# Patient Record
Sex: Male | Born: 2000 | Race: White | Hispanic: No | Marital: Single | Smoking: Never smoker
Health system: Southern US, Community
[De-identification: ages and names within clinical notes are randomized; demographics above are authoritative.]

## PROBLEM LIST (undated history)

## (undated) DIAGNOSIS — I10 Essential (primary) hypertension: Secondary | ICD-10-CM

## (undated) DIAGNOSIS — F909 Attention-deficit hyperactivity disorder, unspecified type: Secondary | ICD-10-CM

## (undated) DIAGNOSIS — J45909 Unspecified asthma, uncomplicated: Secondary | ICD-10-CM

## (undated) HISTORY — PX: KNEE SURGERY: SHX244

## (undated) HISTORY — PX: CIRCUMCISION: SUR203

## (undated) HISTORY — PX: TYMPANOSTOMY TUBE PLACEMENT: SHX32

---
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
## 2019-06-21 ENCOUNTER — Encounter (HOSPITAL_COMMUNITY): Payer: Self-pay

## 2019-06-21 ENCOUNTER — Emergency Department (HOSPITAL_COMMUNITY): Payer: Medicaid Other

## 2019-06-21 ENCOUNTER — Emergency Department (HOSPITAL_COMMUNITY)
Admission: EM | Admit: 2019-06-21 | Discharge: 2019-06-21 | Disposition: A | Discharge status: Home / Self Care | Payer: Medicaid Other | Attending: Emergency Medicine | Admitting: Emergency Medicine

## 2019-06-21 ENCOUNTER — Other Ambulatory Visit: Payer: Self-pay

## 2019-06-21 DIAGNOSIS — F909 Attention-deficit hyperactivity disorder, unspecified type: Secondary | ICD-10-CM | POA: Insufficient documentation

## 2019-06-21 DIAGNOSIS — Z79899 Other long term (current) drug therapy: Secondary | ICD-10-CM | POA: Insufficient documentation

## 2019-06-21 DIAGNOSIS — M25562 Pain in left knee: Secondary | ICD-10-CM

## 2019-06-21 DIAGNOSIS — J45909 Unspecified asthma, uncomplicated: Secondary | ICD-10-CM | POA: Insufficient documentation

## 2019-06-21 DIAGNOSIS — I10 Essential (primary) hypertension: Secondary | ICD-10-CM | POA: Insufficient documentation

## 2019-06-21 MED ORDER — ACETAMINOPHEN 325 MG PO TABS
650.0000 mg | ORAL_TABLET | Freq: Once | ORAL | Status: AC
  Administered 2019-06-21: 650 mg via ORAL
  Filled 2019-06-21: qty 2

## 2019-06-21 NOTE — ED Triage Notes (Signed)
Arrived POV from home. Patient reports his left knee "gave out" on him and when he tried to get up he was unable to stand up on leg. Patient ambulated to and from waiting room with limping gait.

## 2019-06-21 NOTE — Discharge Instructions (Signed)
As discussed, your x-ray was negative for any broken bones.  Continue to use the knee brace and crutches as needed for comfort.  You may take over-the-counter Tylenol as needed for pain.  I have included the number of your orthopedic surgeon.  If symptoms do not improve within the next week, call to schedule an appointment for further evaluation.  Continue to ice and elevate left leg.  Return to the ER for new or worsening symptoms.

## 2019-06-21 NOTE — ED Provider Notes (Signed)
[facility]-EMERGENCY DEPT Provider Note   CSN: [id number] Arrival date & time: 06/21/19  2024     History Chief Complaint  Patient presents with  . Knee Pain    Robert Anthony is a 19 y.o. male with a past medical history significant for ADHD, asthma, and hypertension who presents to the ED due to sudden onset of left knee pain.  Patient states he was working today and his left knee "gave out" causing him to fall directly on his right leg. Denies head injury and back injury from his fall.  He notes his left leg has given out 3 more times since the first event. Patient had a previous left knee surgery in 2017.  He rates his pain a 6/10 worse with movement, especially ambulation.  Denies associated numbness and tingling.  Denies erythema, edema, and warmth. 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  He has tried Tylenol with moderate relief. No recent left knee injury.   History obtained from patient and past medical records. No interpreter used during encounter.       Past Medical History:  Diagnosis Date  . ADHD   . Asthma   . Hypertension     Patient Active Problem List   Diagnosis Date Noted  . Migraine without aura and without status migrainosus, not intractable 07/18/2014  . History of concussion 07/18/2014  . Tension headache 07/18/2014  . Difficulty sleeping 07/18/2014  . Postconcussion syndrome 07/18/2014    Past Surgical History:  Procedure Laterality Date  . CIRCUMCISION    . KNEE SURGERY    . TYMPANOSTOMY TUBE PLACEMENT Bilateral        Family History  Problem Relation Age of Onset  . Migraines Mother   . Depression Mother   . Anxiety disorder Mother   . Bipolar disorder Father   . Depression Father   . Migraines Sister   . Migraines Maternal Uncle   . Mental retardation Maternal Uncle   . Diabetes Other   . Hyperlipidemia Other   . Hypertension Other     Social History   Tobacco Use  . Smoking  status: Never Smoker  . Smokeless tobacco: Never Used  Vaping Use  . Vaping Use: Former  Substance Use Topics  . Alcohol use: No  . Drug use: No    Home Medications Prior to Admission medications   Medication Sig Start Date End Date Taking? Authorizing Provider  amitriptyline (ELAVIL) 25 MG tablet Take 1 tablet (25 mg total) by mouth at bedtime. Patient not taking: Reported on 09/07/2016 07/18/14   Keturah Shavers, MD  amphetamine-dextroamphetamine (ADDERALL) 5 MG tablet Take 1 tablet by mouth every evening. 06/28/14   [provider]  hydrochlorothiazide (HYDRODIURIL) 25 MG tablet Take 1 tablet (25 mg total) by mouth daily. 10/30/17   Raeford Razor, MD  VYVANSE 60 MG capsule Take 60 mg by mouth every morning. 06/28/14   [provider]    Allergies    Patient has no known allergies.  Review of Systems   Review of Systems  Musculoskeletal: Positive for arthralgias and gait problem. Negative for joint swelling.  Skin: Negative for color change.  Neurological: Negative for numbness.  All other systems reviewed and are negative.   Physical Exam Updated Vital Signs BP (!) 159/78 (BP Location: Left Arm)   Pulse (!) 104   Temp 98.3 F (36.8 C) (Oral)   Resp 18   Ht 5\' 11"  (1.803  m)   Wt 111.1 kg   SpO2 100%   BMI 34.17 kg/m   Physical Exam Vitals and nursing note reviewed.  Constitutional:      General: He is not in acute distress.    Appearance: He is not ill-appearing.  HENT:     Head: Normocephalic.  Eyes:     Pupils: Pupils are equal, round, and reactive to light.  Cardiovascular:     Rate and Rhythm: Normal rate and regular rhythm.     Pulses: Normal pulses.     Heart sounds: Normal heart sounds. No murmur heard.  No friction rub. No gallop.   Pulmonary:     Effort: Pulmonary effort is normal.     Breath sounds: Normal breath sounds.  Abdominal:     General: Abdomen is flat. There is no distension.     Palpations: Abdomen is soft.      Tenderness: There is no abdominal tenderness. There is no guarding or rebound.  Musculoskeletal:     Cervical back: Neck supple.     Comments: TTP on anterior aspect of left knee. Full extension and flexion of knee.  No erythema, edema, or warmth.  Distal pulses and sensation intact.  Soft compartments.  No tenderness at left hip or left ankle.  Skin:    General: Skin is warm and dry.  Neurological:     General: No focal deficit present.     Mental Status: He is alert.  Psychiatric:        Mood and Affect: Mood normal.        Behavior: Behavior normal.     ED Results / Procedures / Treatments   Labs (all labs ordered are listed, but only abnormal results are displayed) Labs Reviewed - No data to display  EKG None  Radiology DG Knee Complete 4 Views Left  Result Date: 06/21/2019 CLINICAL DATA:  Knee gave out, pain EXAM: LEFT KNEE - COMPLETE 4+ VIEW COMPARISON:  11/17/2013 FINDINGS: Calcifications within the inferior aspect of the patellar tendon may reflect old injury. Thickening of the inferior patellar tendon. No acute fracture, subluxation or dislocation. No joint effusion. IMPRESSION: Calcifications and thickening of the inferior patellar tendon may reflect old injury. No acute bony abnormality. Electronically Signed   By: Rolm Baptise M.D.   On: 06/21/2019 21:03    Procedures Procedures (including critical care time)  Medications Ordered in ED Medications  acetaminophen (TYLENOL) tablet 650 mg (has no administration in time range)    ED Course  I have reviewed the triage vital signs and the nursing notes.  Pertinent labs & imaging results that were available during my care of the patient were reviewed by me and considered in my medical decision making (see chart for details).    MDM Rules/Calculators/A&P                         19 year old male presents to the ED due to left knee pain that started earlier today.  Patient any previous left knee surgery in 2017.  Stable  vitals.  Patient in no acute distress and non-ill-appearing.  Physical exam reassuring.  Tenderness palpation over anterior aspect of left knee.  Patient able to fully extend and flex left knee.  Patient able to ambulate in the ED with a mild limp.  No erythema, edema, or warmth to suggest septic arthritis.  X-ray ordered at triage and personally reviewed which is negative for any acute abnormalities.  Knee sleeve  and crutches provided here in the ED.  Tylenol given for pain management.  Will discharge patient with orthopedic referral.  RICE discussed with patient.  Advised patient to call orthopedic surgeon if symptoms do not improve within the next week. Strict ED precautions discussed with patient. Patient states understanding and agrees to plan. Patient discharged home in no acute distress and stable vitals  Final Clinical Impression(s) / ED Diagnoses Final diagnoses:  Acute pain of left knee    Rx / DC Orders ED Discharge Orders    None       Jesusita Oka 06/21/19 2223    Charlynne Pander, MD 06/21/19 [phone]

## 2019-07-12 ENCOUNTER — Encounter (HOSPITAL_COMMUNITY): Payer: Self-pay | Admitting: *Deleted

## 2019-07-12 ENCOUNTER — Emergency Department (HOSPITAL_COMMUNITY)
Admission: EM | Admit: 2019-07-12 | Discharge: 2019-07-13 | Disposition: A | Discharge status: Home / Self Care | Payer: Medicaid Other | Attending: Emergency Medicine | Admitting: Emergency Medicine

## 2019-07-12 ENCOUNTER — Other Ambulatory Visit: Payer: Self-pay

## 2019-07-12 ENCOUNTER — Emergency Department (HOSPITAL_COMMUNITY): Payer: Medicaid Other

## 2019-07-12 DIAGNOSIS — Z5321 Procedure and treatment not carried out due to patient leaving prior to being seen by health care provider: Secondary | ICD-10-CM | POA: Insufficient documentation

## 2019-07-12 DIAGNOSIS — R0789 Other chest pain: Secondary | ICD-10-CM | POA: Insufficient documentation

## 2019-07-12 DIAGNOSIS — R000 Tachycardia, unspecified: Secondary | ICD-10-CM | POA: Insufficient documentation

## 2019-07-12 LAB — BASIC METABOLIC PANEL
Anion gap: 9 (ref 5–15)
BUN: 9 mg/dL (ref 6–20)
CO2: 24 mmol/L (ref 22–32)
Calcium: 9.7 mg/dL (ref 8.9–10.3)
Chloride: 106 mmol/L (ref 98–111)
Creatinine, Ser: 1.07 mg/dL (ref 0.61–1.24)
GFR calc Af Amer: >60 mL/min (ref >60)
GFR calc non Af Amer: >60 mL/min (ref >60)
Glucose, Bld: 95 mg/dL (ref 70–99)
Potassium: 4.1 mmol/L (ref 3.5–5.1)
Sodium: 139 mmol/L (ref 135–145)

## 2019-07-12 LAB — CBC
HCT: 49.4 % (ref 39.0–52.0)
Hemoglobin: 17.9 g/dL — ABNORMAL HIGH (ref 13.0–17.0)
MCH: 32.7 pg (ref 26.0–34.0)
MCHC: 36.2 g/dL — ABNORMAL HIGH (ref 30.0–36.0)
MCV: 90.3 fL (ref 80.0–100.0)
Platelets: 155 10*3/uL (ref 150–400)
RBC: 5.47 MIL/uL (ref 4.22–5.81)
RDW: 11.3 % — ABNORMAL LOW (ref 11.5–15.5)
WBC: 8.1 10*3/uL (ref 4.0–10.5)
nRBC: 0 % (ref 0.0–0.2)

## 2019-07-12 LAB — TROPONIN I (HIGH SENSITIVITY)
Troponin I (High Sensitivity): 3 ng/L (ref <18)
Troponin I (High Sensitivity): 2 ng/L (ref <18)

## 2019-07-12 MED ORDER — SODIUM CHLORIDE 0.9% FLUSH: 3.0000 mL | Freq: Once | INTRAVENOUS | Status: DC

## 2019-07-12 NOTE — ED Notes (Signed)
Pt leaving AMA. Pt stated he is going to a different hospital.

## 2019-07-12 NOTE — ED Triage Notes (Signed)
Pt states for several weeks has had left sided chest pain and high heart rate. Pt was seen at [facility] for the same on Sunday, says it has not gotten better.

## 2019-07-26 ENCOUNTER — Ambulatory Visit: Payer: Medicaid Other | Admitting: Orthopedic Surgery

## 2019-08-04 ENCOUNTER — Ambulatory Visit: Payer: Medicaid Other | Admitting: Surgical

## 2020-07-10 ENCOUNTER — Emergency Department (HOSPITAL_COMMUNITY)
Admission: EM | Admit: 2020-07-10 | Discharge: 2020-07-10 | Disposition: A | Discharge status: Home / Self Care | Payer: Medicaid Other | Attending: Emergency Medicine | Admitting: Emergency Medicine

## 2020-07-10 ENCOUNTER — Emergency Department (HOSPITAL_COMMUNITY): Payer: Medicaid Other

## 2020-07-10 ENCOUNTER — Other Ambulatory Visit: Payer: Self-pay

## 2020-07-10 DIAGNOSIS — I10 Essential (primary) hypertension: Secondary | ICD-10-CM | POA: Diagnosis not present

## 2020-07-10 DIAGNOSIS — J45909 Unspecified asthma, uncomplicated: Secondary | ICD-10-CM | POA: Diagnosis not present

## 2020-07-10 DIAGNOSIS — Z79899 Other long term (current) drug therapy: Secondary | ICD-10-CM | POA: Insufficient documentation

## 2020-07-10 DIAGNOSIS — R079 Chest pain, unspecified: Secondary | ICD-10-CM | POA: Insufficient documentation

## 2020-07-10 LAB — CBC WITH DIFFERENTIAL/PLATELET
Abs Immature Granulocytes: 0.02 10*3/uL (ref 0.00–0.07)
Basophils Absolute: 0.1 10*3/uL (ref 0.0–0.1)
Basophils Relative: 1 %
Eosinophils Absolute: 0.2 10*3/uL (ref 0.0–0.5)
Eosinophils Relative: 3 %
HCT: 45 % (ref 39.0–52.0)
Hemoglobin: 16.2 g/dL (ref 13.0–17.0)
Immature Granulocytes: 0 %
Lymphocytes Relative: 26 %
Lymphs Abs: 1.9 10*3/uL (ref 0.7–4.0)
MCH: 32.2 pg (ref 26.0–34.0)
MCHC: 36 g/dL (ref 30.0–36.0)
MCV: 89.5 fL (ref 80.0–100.0)
Monocytes Absolute: 0.5 10*3/uL (ref 0.1–1.0)
Monocytes Relative: 7 %
Neutro Abs: 4.6 10*3/uL (ref 1.7–7.7)
Neutrophils Relative %: 63 %
Platelets: 137 10*3/uL — ABNORMAL LOW (ref 150–400)
RBC: 5.03 MIL/uL (ref 4.22–5.81)
RDW: 11.4 % — ABNORMAL LOW (ref 11.5–15.5)
WBC: 7.2 10*3/uL (ref 4.0–10.5)
nRBC: 0 % (ref 0.0–0.2)

## 2020-07-10 LAB — BASIC METABOLIC PANEL
Anion gap: 9 (ref 5–15)
BUN: 8 mg/dL (ref 6–20)
CO2: 19 mmol/L — ABNORMAL LOW (ref 22–32)
Calcium: 8.9 mg/dL (ref 8.9–10.3)
Chloride: 111 mmol/L (ref 98–111)
Creatinine, Ser: 0.77 mg/dL (ref 0.61–1.24)
GFR, Estimated: >60 mL/min (ref >60)
Glucose, Bld: 83 mg/dL (ref 70–99)
Potassium: 3.5 mmol/L (ref 3.5–5.1)
Sodium: 139 mmol/L (ref 135–145)

## 2020-07-10 LAB — TROPONIN I (HIGH SENSITIVITY): Troponin I (High Sensitivity): 2 ng/L (ref <18)

## 2020-07-10 MED ORDER — KETOROLAC TROMETHAMINE 30 MG/ML IJ SOLN
30.0000 mg | Freq: Once | INTRAMUSCULAR | Status: AC
  Administered 2020-07-10: 30 mg via INTRAVENOUS
  Filled 2020-07-10: qty 1

## 2020-07-10 NOTE — ED Notes (Signed)
Informed pt. Provider will be in shortly.

## 2020-07-10 NOTE — ED Provider Notes (Signed)
[facility] EMERGENCY DEPARTMENT Provider Note   CSN: [id number] Arrival date & time: 07/10/20  1722     History Chief Complaint  Patient presents with   Chest Pain    Robert Anthony is a 20 y.o. male.  HPI  20 year old male with past medical history of asthma, HTN presents emergency department concern for right-sided chest pain.  Patient states on Friday his fiance broke up with him.  Since then he has been having intermittent right-sided chest pain, brief, sharp, self resolved.  He has had pain like this before last time when he had a stressful life event.  He presents today because the pain when it resolved left a soreness in the right-sided chest.  No associated shortness of breath, cough, lower extremity swelling.  Pain is worse with movements.  No fever.  Past Medical History:  Diagnosis Date   ADHD    Asthma    Hypertension     Patient Active Problem List   Diagnosis Date Noted   Migraine without aura and without status migrainosus, not intractable 07/18/2014   History of concussion 07/18/2014   Tension headache 07/18/2014   Difficulty sleeping 07/18/2014   Postconcussion syndrome 07/18/2014    Past Surgical History:  Procedure Laterality Date   CIRCUMCISION     KNEE SURGERY     TYMPANOSTOMY TUBE PLACEMENT Bilateral        Family History  Problem Relation Age of Onset   Migraines Mother    Depression Mother    Anxiety disorder Mother    Bipolar disorder Father    Depression Father    Migraines Sister    Migraines Maternal Uncle    Mental retardation Maternal Uncle    Diabetes Other    Hyperlipidemia Other    Hypertension Other     Social History   Tobacco Use   Smoking status: Never   Smokeless tobacco: Never  Vaping Use   Vaping Use: Former  Substance Use Topics   Alcohol use: No   Drug use: No    Home Medications Prior to Admission medications   Medication Sig Start Date End Date Taking? Authorizing Provider   amitriptyline (ELAVIL) 25 MG tablet Take 1 tablet (25 mg total) by mouth at bedtime. Patient not taking: No sig reported 07/18/14   Keturah Shavers, MD  amphetamine-dextroamphetamine (ADDERALL) 5 MG tablet Take 1 tablet by mouth every evening. Patient not taking: Reported on 07/10/2020 06/28/14   [provider]  hydrochlorothiazide (HYDRODIURIL) 25 MG tablet Take 1 tablet (25 mg total) by mouth daily. Patient not taking: Reported on 07/10/2020 10/30/17   Raeford Razor, MD    Allergies    Patient has no known allergies.  Review of Systems   Review of Systems  Constitutional:  Negative for chills and fever.  HENT:  Negative for congestion.   Respiratory:  Negative for cough, chest tightness and shortness of breath.   Cardiovascular:  Positive for chest pain. Negative for palpitations and leg swelling.  Gastrointestinal:  Negative for abdominal pain, diarrhea and vomiting.  Genitourinary:  Negative for dysuria.  Musculoskeletal:  Negative for back pain.  Skin:  Negative for rash.  Neurological:  Negative for headaches.   Physical Exam Updated Vital Signs BP 135/71   Pulse 96   Temp 98 F (36.7 C) (Oral)   Resp (!) 22   Ht 6' (1.829 m)   Wt 111.1 kg   SpO2 97%   BMI 33.23 kg/m   Physical Exam Vitals  and nursing note reviewed.  Constitutional:      General: He is not in acute distress.    Appearance: Normal appearance. He is not ill-appearing, toxic-appearing or diaphoretic.  HENT:     Head: Normocephalic.     Mouth/Throat:     Mouth: Mucous membranes are moist.  Cardiovascular:     Rate and Rhythm: Normal rate.  Pulmonary:     Effort: Pulmonary effort is normal. No respiratory distress.  Chest:     Chest wall: Tenderness present.  Abdominal:     Palpations: Abdomen is soft.     Tenderness: There is no abdominal tenderness.  Skin:    General: Skin is warm.  Neurological:     Mental Status: He is alert and oriented to person, place, and time. Mental status is  at baseline.  Psychiatric:        Mood and Affect: Mood normal.    ED Results / Procedures / Treatments   Labs (all labs ordered are listed, but only abnormal results are displayed) Labs Reviewed  CBC WITH DIFFERENTIAL/PLATELET - Abnormal; Notable for the following components:      Result Value   RDW 11.4 (*)    Platelets 137 (*)    All other components within normal limits  BASIC METABOLIC PANEL - Abnormal; Notable for the following components:   CO2 19 (*)    All other components within normal limits  TROPONIN I (HIGH SENSITIVITY)  TROPONIN I (HIGH SENSITIVITY)    EKG EKG Interpretation  Date/Time:  Wednesday July 10 2020 17:35:36 EDT Ventricular Rate:  98 PR Interval:  162 QRS Duration: 91 QT Interval:  329 QTC Calculation: 420 R Axis:   64 Text Interpretation: Sinus rhythm Atrial premature complex NSR, similar to previous Confirmed by Coralee Pesa [phone]) on 07/10/2020 5:52:30 PM  Radiology DG Chest 2 View  Result Date: 07/10/2020 CLINICAL DATA:  Chest pain for 2 days. EXAM: CHEST - 2 VIEW COMPARISON:  07/12/2019 FINDINGS: The heart size and mediastinal contours are within normal limits. Both lungs are clear. The visualized skeletal structures are unremarkable. IMPRESSION: Normal exam. Electronically Signed   By: Danae Orleans M.D.   On: 07/10/2020 19:15    Procedures Procedures   Medications Ordered in ED Medications  ketorolac (TORADOL) 30 MG/ML injection 30 mg (30 mg Intravenous Given 07/10/20 1845)    ED Course  I have reviewed the triage vital signs and the nursing notes.  Pertinent labs & imaging results that were available during my care of the patient were reviewed by me and considered in my medical decision making (see chart for details).    MDM Rules/Calculators/A&P                          20 year old male presents the emergency department with right-sided chest pain.  He has had pain like this before related to a stressful life event, he is currently  undergoing a break-up with his fiance.  Vitals are normal and stable.  He is very well-appearing.  He has worse chest pain with movement and it is reproducible on exam.  EKG shows no ischemic changes.  Cardiac work-up is negative, do not feel a repeat troponin is necessary as this seems very musculoskeletal.  Low suspicion for AC.  No findings consistent with PE, he is PERC negative.  After Toradol patient states the pain is completely resolved.  Patient will be discharged and treated as an outpatient.  Discharge plan and  strict return to ED precautions discussed, patient verbalizes understanding and agreement.   Final Clinical Impression(s) / ED Diagnoses Final diagnoses:  Chest pain, unspecified type    Rx / DC Orders ED Discharge Orders     None        Rozelle Logan, DO 07/10/20 2116

## 2020-07-10 NOTE — ED Triage Notes (Signed)
Pt bib ems DUE TO chest pain. Pt states he broke up with his fiance 2 days ago. Pt has a hx of bipolar and depression. Pt is hypertensive. Pt took 324 of aspirin via ems.

## 2020-07-10 NOTE — ED Notes (Signed)
Patient verbalizes understanding of discharge instructions. Follow-up care reviewed. Opportunity for questioning and answers were provided. Armband removed by staff, pt discharged from ED ambulatory. Work note provided.

## 2020-07-10 NOTE — ED Notes (Signed)
Patient transported to X-ray 

## 2020-07-10 NOTE — ED Notes (Signed)
Patient returned from X-ray 

## 2020-07-10 NOTE — Discharge Instructions (Addendum)
You have been seen and discharged from the emergency department.  Your heart work-up was normal.  I believe your pain is most likely musculoskeletal/stress related.  Take Tylenol and/or ibuprofen as needed for pain control.  Follow-up with your primary provider for reevaluation and further care. Take home medications as prescribed. If you have any worsening symptoms or further concerns for your health please return to an emergency department for further evaluation.

## 2021-02-07 ENCOUNTER — Emergency Department (HOSPITAL_COMMUNITY): Payer: Medicaid Other

## 2021-02-07 ENCOUNTER — Other Ambulatory Visit: Payer: Self-pay

## 2021-02-07 ENCOUNTER — Emergency Department (HOSPITAL_COMMUNITY)
Admission: EM | Admit: 2021-02-07 | Discharge: 2021-02-07 | Disposition: A | Discharge status: Home / Self Care | Payer: Medicaid Other | Attending: Emergency Medicine | Admitting: Emergency Medicine

## 2021-02-07 ENCOUNTER — Encounter (HOSPITAL_COMMUNITY): Payer: Self-pay

## 2021-02-07 DIAGNOSIS — R000 Tachycardia, unspecified: Secondary | ICD-10-CM | POA: Insufficient documentation

## 2021-02-07 DIAGNOSIS — J020 Streptococcal pharyngitis: Secondary | ICD-10-CM | POA: Insufficient documentation

## 2021-02-07 DIAGNOSIS — R590 Localized enlarged lymph nodes: Secondary | ICD-10-CM | POA: Diagnosis not present

## 2021-02-07 DIAGNOSIS — J029 Acute pharyngitis, unspecified: Secondary | ICD-10-CM | POA: Diagnosis present

## 2021-02-07 LAB — CBC WITH DIFFERENTIAL/PLATELET
Abs Immature Granulocytes: 0.05 10*3/uL (ref 0.00–0.07)
Basophils Absolute: 0.1 10*3/uL (ref 0.0–0.1)
Basophils Relative: 0 %
Eosinophils Absolute: 0.1 10*3/uL (ref 0.0–0.5)
Eosinophils Relative: 1 %
HCT: 49.6 % (ref 39.0–52.0)
Hemoglobin: 18.2 g/dL — ABNORMAL HIGH (ref 13.0–17.0)
Immature Granulocytes: 0 %
Lymphocytes Relative: 12 %
Lymphs Abs: 1.5 10*3/uL (ref 0.7–4.0)
MCH: 32.6 pg (ref 26.0–34.0)
MCHC: 36.7 g/dL — ABNORMAL HIGH (ref 30.0–36.0)
MCV: 88.7 fL (ref 80.0–100.0)
Monocytes Absolute: 0.8 10*3/uL (ref 0.1–1.0)
Monocytes Relative: 7 %
Neutro Abs: 9.4 10*3/uL — ABNORMAL HIGH (ref 1.7–7.7)
Neutrophils Relative %: 80 %
Platelets: 142 10*3/uL — ABNORMAL LOW (ref 150–400)
RBC: 5.59 MIL/uL (ref 4.22–5.81)
RDW: 11.2 % — ABNORMAL LOW (ref 11.5–15.5)
WBC: 11.9 10*3/uL — ABNORMAL HIGH (ref 4.0–10.5)
nRBC: 0 % (ref 0.0–0.2)

## 2021-02-07 LAB — BASIC METABOLIC PANEL
Anion gap: 10 (ref 5–15)
BUN: 12 mg/dL (ref 6–20)
CO2: 21 mmol/L — ABNORMAL LOW (ref 22–32)
Calcium: 9.3 mg/dL (ref 8.9–10.3)
Chloride: 106 mmol/L (ref 98–111)
Creatinine, Ser: 0.73 mg/dL (ref 0.61–1.24)
GFR, Estimated: >60 mL/min (ref >60)
Glucose, Bld: 94 mg/dL (ref 70–99)
Potassium: 4 mmol/L (ref 3.5–5.1)
Sodium: 137 mmol/L (ref 135–145)

## 2021-02-07 LAB — GROUP A STREP BY PCR: Group A Strep by PCR: DETECTED — AB

## 2021-02-07 MED ORDER — DEXAMETHASONE SODIUM PHOSPHATE 10 MG/ML IJ SOLN
10.0000 mg | Freq: Once | INTRAMUSCULAR | Status: AC
  Administered 2021-02-07: 10 mg via INTRAVENOUS
  Filled 2021-02-07: qty 1

## 2021-02-07 MED ORDER — PENICILLIN V POTASSIUM 500 MG PO TABS: 500.0000 mg | ORAL_TABLET | Freq: Two times a day (BID) | ORAL | 0 refills | Status: AC

## 2021-02-07 MED ORDER — IOHEXOL 300 MG/ML  SOLN
75.0000 mL | Freq: Once | INTRAMUSCULAR | Status: AC | PRN
  Administered 2021-02-07: 75 mL via INTRAVENOUS

## 2021-02-07 MED ORDER — SODIUM CHLORIDE (PF) 0.9 % IJ SOLN
INTRAMUSCULAR | Status: AC
  Filled 2021-02-07: qty 50

## 2021-02-07 NOTE — ED Notes (Signed)
Patient offered a beverage. °

## 2021-02-07 NOTE — Discharge Instructions (Addendum)
You were seen in the emergency department today for sore throat.  While you are here you are diagnosed with strep throat.  We took an image of your neck because you were having some difficulty swallowing.  This image was reassuring.  We gave you some steroids to reduce the swelling in the back your throat.  You will start penicillin you will take it twice a day over the next 10 days.  Please take this to completion.  Please return to the emergency department if he began having drooling, inability to swallow liquids, wheezing, difficulty breathing or shortness of breath.

## 2021-02-07 NOTE — ED Provider Triage Note (Signed)
Emergency Medicine Provider Triage Evaluation Note  Robert Anthony , a 21 y.o. male  was evaluated in triage.  Pt complains of her throat and throat swelling.  Patient states that since Monday he began having sore throat which has progressed to today where he feels like he has difficulty swallowing and tolerating his secretions.  He has been unable to eat foods.  Dors is change in voice.  He denies any fevers, other upper respiratory symptoms.  Mother notes that he appears to have some swelling in his neck.  No wheezing or stridor.  Review of Systems  Positive: See above Negative:   Physical Exam  BP (!) 152/103 (BP Location: Left Arm)    Pulse (!) 106    Temp 98.8 F (37.1 C) (Oral)    Resp 16    Ht 5\' 11"  (1.803 m)    Wt 113.4 kg    SpO2 98%    BMI 34.87 kg/m  Gen:   Awake, no distress   Resp:  Normal effort  MSK:   Moves extremities without difficulty  Other:  Oropharynx with trismus.  Difficulty seeing the posterior oropharynx.  He does have tonsillar swelling that I can appreciate.  No swelling under the tongue.  Cervical adenopathy present.  Tenderness to palpation particularly on the right side.  Potato voice.  Medical Decision Making  Medically screening exam initiated at 12:46 PM.  Appropriate orders placed.  [facility] was informed that the remainder of the evaluation will be completed by another provider, this initial triage assessment does not replace that evaluation, and the importance of remaining in the ED until their evaluation is complete.     Beryle Quant, PA-C 02/07/21 1248

## 2021-02-07 NOTE — ED Triage Notes (Addendum)
Patient reports having a sore throat x 1 week and worsening. Patient states that it is difficult to swallow. Patient is talking in complete sentences and is able to swallow his own saliva. Patient does have swelling to the throat.  Patient went to an UC and was placed on the z-pack 4 days ago.

## 2021-02-07 NOTE — ED Provider Notes (Signed)
[facility]-EMERGENCY DEPT Provider Note   CSN: [id number] Arrival date & time: 02/07/21  1231     History  Chief Complaint  Patient presents with   Sore Throat    Robert Anthony is a 21 y.o. male.  With no pertinent past medical history presents to the emergency department with sore throat.  Patient states that since Monday he has began having sore throat which she states has progressed throughout the week.  He states that today he has had difficulty swallowing and tolerating his secretions at times.  He states that he has been unable to eat solid foods but has had some liquids.  He does endorse feeling like his had a change in voice.  He states his mother has noted that he appears to have some swelling in his neck.  He denies any fevers, cough, shortness of breath, wheezing, chest pain, congestion or rhinorrhea.   Sore Throat Pertinent negatives include no shortness of breath.      Home Medications Prior to Admission medications   Medication Sig Start Date End Date Taking? Authorizing Provider  amitriptyline (ELAVIL) 25 MG tablet Take 1 tablet (25 mg total) by mouth at bedtime. Patient not taking: No sig reported 07/18/14   Keturah Shavers, MD  amphetamine-dextroamphetamine (ADDERALL) 5 MG tablet Take 1 tablet by mouth every evening. Patient not taking: Reported on 07/10/2020 06/28/14   [provider]  hydrochlorothiazide (HYDRODIURIL) 25 MG tablet Take 1 tablet (25 mg total) by mouth daily. Patient not taking: Reported on 07/10/2020 10/30/17   Raeford Razor, MD      Allergies    Patient has no known allergies.    Review of Systems   Review of Systems  Constitutional:  Positive for appetite change. Negative for fever.  HENT:  Positive for sore throat and trouble swallowing. Negative for congestion and drooling.   Respiratory:  Negative for shortness of breath.   All other systems reviewed and are negative.  Physical Exam Updated Vital  Signs BP (!) 152/103 (BP Location: Left Arm)    Pulse (!) 106    Temp 98.8 F (37.1 C) (Oral)    Resp 16    Ht 5\' 11"  (1.803 m)    Wt 113.4 kg    SpO2 98%    BMI 34.87 kg/m  Physical Exam Vitals and nursing note reviewed.  Constitutional:      General: He is not in acute distress.    Appearance: Normal appearance. He is well-developed. He is ill-appearing. He is not toxic-appearing.  HENT:     Head: Normocephalic and atraumatic.     Jaw: Trismus present.     Right Ear: Tympanic membrane normal.     Left Ear: Tympanic membrane normal.     Nose: No congestion.     Mouth/Throat:     Mouth: Mucous membranes are moist.     Dentition: Normal dentition.     Pharynx: Uvula midline. Pharyngeal swelling, oropharyngeal exudate and posterior oropharyngeal erythema present.     Tonsils: Tonsillar exudate present. No tonsillar abscesses. 2+ on the right. 2+ on the left.  Eyes:     General: No scleral icterus.    Conjunctiva/sclera: Conjunctivae normal.  Cardiovascular:     Rate and Rhythm: Regular rhythm. Tachycardia present.     Heart sounds: Normal heart sounds. No murmur heard. Pulmonary:     Effort: Pulmonary effort is normal. No respiratory distress.     Breath sounds: Normal breath sounds. No stridor. No wheezing.  Abdominal:     General: Bowel sounds are normal.     Palpations: Abdomen is soft.  Musculoskeletal:     Cervical back: Normal range of motion.  Lymphadenopathy:     Cervical: Cervical adenopathy present.  Skin:    General: Skin is warm and dry.     Capillary Refill: Capillary refill takes less than 2 seconds.     Findings: No rash.  Neurological:     General: No focal deficit present.     Mental Status: He is alert and oriented to person, place, and time.  Psychiatric:        Mood and Affect: Mood normal.        Behavior: Behavior normal.    ED Results / Procedures / Treatments   Labs (all labs ordered are listed, but only abnormal results are displayed) Labs  Reviewed  GROUP A STREP BY PCR - Abnormal; Notable for the following components:      Result Value   Group A Strep by PCR DETECTED (*)    All other components within normal limits  BASIC METABOLIC PANEL - Abnormal; Notable for the following components:   CO2 21 (*)    All other components within normal limits  CBC WITH DIFFERENTIAL/PLATELET - Abnormal; Notable for the following components:   WBC 11.9 (*)    Hemoglobin 18.2 (*)    MCHC 36.7 (*)    RDW 11.2 (*)    Platelets 142 (*)    Neutro Abs 9.4 (*)    All other components within normal limits   EKG None  Radiology CT Soft Tissue Neck W Contrast  Result Date: 02/07/2021 CLINICAL DATA:  Provided history: Soft tissue swelling, infection suspected, neck x-ray done. Additional history provided: Patient reports sore throat for 1 week, worsening, difficulty swallowing. EXAM: CT NECK WITH CONTRAST TECHNIQUE: Multidetector CT imaging of the neck was performed using the standard protocol following the bolus administration of intravenous contrast. RADIATION DOSE REDUCTION: This exam was performed according to the departmental dose-optimization program which includes automated exposure control, adjustment of the mA and/or kV according to patient size and/or use of iterative reconstruction technique. CONTRAST:  75mL OMNIPAQUE IOHEXOL 300 MG/ML  SOLN COMPARISON:  None FINDINGS: Pharynx and larynx: Symmetric prominence of the palatine tonsils. No evidence of peritonsillar abscess. The oropharyngeal airway remains patent. No appreciable swelling or discrete mass elsewhere within the oral cavity, pharynx or larynx. Unremarkable appearance of the epiglottis. No retropharyngeal collection. Salivary glands: No inflammation, mass, or stone. Thyroid: Normal. Lymph nodes: Bilateral level 2/3 lymphadenopathy with lymph nodes measuring up to 16 mm in short axis (for instance as seen on series 6, image 63). Vascular: The major vascular structures of the neck are  patent. Limited intracranial: No evidence of acute intracranial abnormality within the field of view. Visualized orbits: No orbital mass or acute orbital finding. 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 Small-volume fluid within a posterior right ethmoid air cell. Mild mucosal thickening and small mucous retention cysts within the bilateral maxillary sinuses. No significant mastoid effusion. Skeleton: Cervicothoracic levocurvature. Straightening of the expected cervical lordosis. No acute bony abnormality or aggressive osseous lesion. Upper chest: No consolidation within the imaged lung apices. IMPRESSION: Symmetric prominence of the palatine tonsils. Bilateral level 2/3 lymphadenopathy (with lymph nodes measuring up to 16 mm in short axis). This constellation of findings likely reflects acute pharyngitis/tonsillitis with reactive lymphadenopathy given the provided history. However, clinical follow-up is recommended (with imaging follow-up as  warranted) to ensure resolution and to exclude alternative etiologies. No evidence of peritonsillar abscess. Right ethmoid and bilateral maxillary sinus disease, as described. Cervicothoracic levocurvature. Electronically Signed   By: Jackey LogeKyle  Golden D.O.   On: 02/07/2021 14:51    Procedures Procedures   Medications Ordered in ED Medications  sodium chloride (PF) 0.9 % injection (has no administration in time range)  dexamethasone (DECADRON) injection 10 mg (10 mg Intravenous Given 02/07/21 1341)  iohexol (OMNIPAQUE) 300 MG/ML solution 75 mL (75 mLs Intravenous Contrast Given 02/07/21 1422)    ED Course/ Medical Decision Making/ A&P                           Medical Decision Making Amount and/or Complexity of Data Reviewed Labs: ordered. Radiology: ordered.  Risk Prescription drug management.  Patient presents to the ED with complaints of sore throat. This involves an extensive number of treatment options,  and is a complaint that carries with it a moderate risk of complications and morbidity.   Additional history obtained:  Additional history obtained from: Mother External records from outside source obtained and reviewed including: Previous ED visits  Lab Results: I personally ordered, reviewed, and interpreted labs. Pertinent results include: Group A strep positive CBC with leukocytosis to 11.9 BMP without electrolyte abnormalities  Imaging Studies ordered:  I ordered imaging studies which included CT.  I independently reviewed & interpreted imaging & am in agreement with radiology impression. Imaging shows: CT soft tissue of the neck shows prominence of the palatine tonsils.  Lymphadenopathy.  Findings likely due to pharyngitis/tonsillitis with reactive lymphadenopathy.  No evidence of peritonsillar abscess.  Medications  I ordered medication including Decadron for swelling Reevaluation of the patient after medication shows that patient improved  Critical Interventions: Steroid  ED Course: 21 year old male who presents to the emergency department with sore throat.  No history of immunocompromise.  Nontoxic in appearance.  His physical exam was somewhat concerning with trismus, inability to tolerate solid foods.  However tolerating secretions, no stridor, wheezing or imminent airway closure.  Group A strep positive.  He does have tonsillar swelling with exudates present.  Given IV Decadron with improvement of his symptoms throughout his emergency department stay.  He feels that his trismus has improved as well as his ability to swallow.  Given p.o. trial here in the emergency department without difficulty.  Given history and exam and low suspicion for PTA, RPA, Ludewig's angina, epiglottitis, bacterial tracheitis, EBV, HIV.  Given that his group A strep is positive we will treat him with penicillin.  Instructed to complete the entire course, which he verbalized understanding.  Discussed  return precautions for worsening difficulty breathing, drooling, wheezing, inability to swallow liquids.  He verbalized understanding.  After consideration of the diagnostic results and the patients response to treatment, I feel that the patent would benefit from discharge. The patient has been appropriately medically screened and/or stabilized in the ED. I have low suspicion for any other emergent medical condition which would require further screening, evaluation or treatment in the ED or require inpatient management. The patient is overall well appearing and non-toxic in appearance. They are hemodynamically stable at time of discharge.   Final Clinical Impression(s) / ED Diagnoses Final diagnoses:  Strep throat    Rx / DC Orders ED Discharge Orders          Ordered    penicillin v potassium (VEETID) 500 MG tablet  2 times daily  02/07/21 1543              Cristopher Peru, PA-C 02/07/21 1651    Rolan Bucco, MD 02/07/21 [phone]

## 2022-07-04 ENCOUNTER — Encounter (HOSPITAL_COMMUNITY): Payer: Self-pay | Admitting: Emergency Medicine

## 2022-07-04 ENCOUNTER — Emergency Department (HOSPITAL_COMMUNITY): Payer: Medicaid Other

## 2022-07-04 ENCOUNTER — Other Ambulatory Visit: Payer: Self-pay

## 2022-07-04 ENCOUNTER — Emergency Department (HOSPITAL_COMMUNITY)
Admission: EM | Admit: 2022-07-04 | Discharge: 2022-07-04 | Disposition: A | Discharge status: Home / Self Care | Payer: Medicaid Other | Attending: Emergency Medicine | Admitting: Emergency Medicine

## 2022-07-04 DIAGNOSIS — Y990 Civilian activity done for income or pay: Secondary | ICD-10-CM | POA: Diagnosis not present

## 2022-07-04 DIAGNOSIS — S93402A Sprain of unspecified ligament of left ankle, initial encounter: Secondary | ICD-10-CM

## 2022-07-04 DIAGNOSIS — W1830XA Fall on same level, unspecified, initial encounter: Secondary | ICD-10-CM | POA: Insufficient documentation

## 2022-07-04 DIAGNOSIS — M25572 Pain in left ankle and joints of left foot: Secondary | ICD-10-CM | POA: Diagnosis present

## 2022-07-04 DIAGNOSIS — S93492A Sprain of other ligament of left ankle, initial encounter: Secondary | ICD-10-CM | POA: Insufficient documentation

## 2022-07-04 MED ORDER — IBUPROFEN 800 MG PO TABS: 800.0000 mg | ORAL_TABLET | Freq: Three times a day (TID) | ORAL | 0 refills | Status: AC

## 2022-07-04 MED ORDER — IBUPROFEN 800 MG PO TABS
800.0000 mg | ORAL_TABLET | Freq: Once | ORAL | Status: AC
  Administered 2022-07-04: 800 mg via ORAL
  Filled 2022-07-04: qty 1

## 2022-07-04 NOTE — ED Triage Notes (Signed)
Pt to ED via [facility] from work c/o fall tonight.  Was walking when suddenly left leg became numb and gave out on patient.  Denies hitting head or LOC.

## 2022-07-04 NOTE — ED Provider Notes (Signed)
[place] EMERGENCY DEPARTMENT AT [facility] Provider Note   CSN: [id number] Arrival date & time: 07/04/22  1191     History  Chief Complaint  Patient presents with   Robert Anthony is a 22 y.o. male who presents to the ED via EMS after a fall at work.  Patient reports that he felt numbness in his left thigh prior to falling.  He landed on his left knee and left hand and wrist.  Patient is on any blood thinners, denies loss of consciousness or head injury at the time of the incident.  Since then he has had some pain to his left wrist and knee.  Patient did not take any medication for pain prior to his arrival to the ED.  He has a history of surgery to the left knee in 2017 to remove loose bone fragments but has not had any pain in the knee since then, until today.  Patient reports that he has been experiencing "shooting numbness and pain" that occurs in random locations in his left leg for the past 2 days.  He has never experienced anything like this before.  Patient reports that about 3 days ago he twisted his left ankle and reports persistent pain and swelling.  He is able to ambulate on that leg.  He is unsure if the numbness and pain that he is now experiencing is due to hurting his ankle or not.  No back pain or hip pain.  He denies any other complaints or concerns at this time.    Home Medications Prior to Admission medications   Medication Sig Start Date End Date Taking? Authorizing Provider  ibuprofen (ADVIL) 800 MG tablet Take 1 tablet (800 mg total) by mouth 3 (three) times daily for 10 days. 07/04/22 07/14/22 Yes Maxwell Marion, PA-C  amitriptyline (ELAVIL) 25 MG tablet Take 1 tablet (25 mg total) by mouth at bedtime. Patient not taking: No sig reported 07/18/14   Keturah Shavers, MD  amphetamine-dextroamphetamine (ADDERALL) 5 MG tablet Take 1 tablet by mouth every evening. Patient not taking: Reported on 07/10/2020 06/28/14   [provider]   hydrochlorothiazide (HYDRODIURIL) 25 MG tablet Take 1 tablet (25 mg total) by mouth daily. Patient not taking: Reported on 07/10/2020 10/30/17   Raeford Razor, MD      Allergies    Patient has no known allergies.    Review of Systems   Review of Systems  Musculoskeletal:  Positive for joint swelling.       Left wrist, ankle, and knee pain  All other systems reviewed and are negative.   Physical Exam Updated Vital Signs BP 128/78 (BP Location: Right Arm)   Pulse 92   Temp 98.4 F (36.9 C) (Oral)   Resp 16   Ht 5\' 11"  (1.803 m)   Wt 113.4 kg   SpO2 99%   BMI 34.87 kg/m  Physical Exam Vitals and nursing note reviewed.  Constitutional:      Appearance: Normal appearance.  HENT:     Head: Normocephalic and atraumatic.     Mouth/Throat:     Mouth: Mucous membranes are moist.  Eyes:     Conjunctiva/sclera: Conjunctivae normal.     Pupils: Pupils are equal, round, and reactive to light.  Cardiovascular:     Rate and Rhythm: Normal rate and regular rhythm.     Pulses: Normal pulses.     Heart sounds: Normal heart sounds.  Pulmonary:  Effort: Pulmonary effort is normal.     Breath sounds: Normal breath sounds.  Abdominal:     Palpations: Abdomen is soft.     Tenderness: There is no abdominal tenderness.  Musculoskeletal:        General: Tenderness present. Normal range of motion.     Comments: Tenderness to palpation of volar aspect of left wrist, left tibial tuberosity, and left lateral ankle with mild swelling.  No snuffbox tenderness appreciated. full range of motion of left wrist, knee, and ankle appreciated on exam.   Skin:    General: Skin is warm and dry.     Capillary Refill: Capillary refill takes less than 2 seconds.     Findings: No erythema, lesion or rash.  Neurological:     General: No focal deficit present.     Mental Status: He is alert.     Sensory: No sensory deficit.     Motor: No weakness.  Psychiatric:        Mood and Affect: Mood normal.         Behavior: Behavior normal.     ED Results / Procedures / Treatments   Labs (all labs ordered are listed, but only abnormal results are displayed) Labs Reviewed - No data to display  EKG None  Radiology DG Ankle Complete Left  Result Date: 07/04/2022 CLINICAL DATA:  Twisting left ankle injury a few days ago with pain. EXAM: LEFT ANKLE COMPLETE - 3+ VIEW COMPARISON:  None Available. FINDINGS: There is no evidence of fracture, dislocation, or joint effusion. There is no evidence of arthropathy or other focal bone abnormality. Soft tissues are unremarkable. IMPRESSION: Negative. Electronically Signed   By: Elberta Fortis M.D.   On: 07/04/2022 09:38   DG Wrist Complete Left  Result Date: 07/04/2022 CLINICAL DATA:  22 year old male with history of left wrist pain. EXAM: LEFT WRIST - COMPLETE 3+ VIEW COMPARISON:  Left hand radiograph 10/10/2015. FINDINGS: Mild ulna minus variance. No acute displaced fracture, subluxation or dislocation. IMPRESSION: 1. No acute radiographic abnormality of the left wrist. 2. Mild ulna minus variance. Electronically Signed   By: Trudie Reed M.D.   On: 07/04/2022 07:58   DG Knee 2 Views Left  Result Date: 07/04/2022 CLINICAL DATA:  22 year old male with history of left knee pain. EXAM: LEFT KNEE - 1-2 VIEW COMPARISON:  No priors. FINDINGS: No evidence of fracture, dislocation, or joint effusion. No evidence of arthropathy or other focal bone abnormality. Soft tissues are unremarkable. IMPRESSION: Negative. Electronically Signed   By: Trudie Reed M.D.   On: 07/04/2022 07:56    Procedures Procedures: not indicated.   Medications Ordered in ED Medications  ibuprofen (ADVIL) tablet 800 mg (800 mg Oral Given 07/04/22 0750)    ED Course/ Medical Decision Making/ A&P                             Medical Decision Making Amount and/or Complexity of Data Reviewed Radiology: ordered.  Risk Prescription drug management.   Patient is a 22 year old  male with no significant past medical history who presents ED today after a fall.  Patient did not lose consciousness or hit his head but did land on his left hand and left knee after experiencing an episode of numbness and pain in his left thigh, which caused his leg to give out.  He reports twisting his left ankle a day prior to the onset of sporadic leg numbness and pain.  My differentials include: sciatic nerve pain, arthritic changes, ligamentous injury, dislocation, fracture, etc.  On physical exam, patient has tenderness to the left tibial tuberosity, swelling and tenderness to palpation of the lateral aspect of the left ankle, tenderness to the volar aspect of the left wrist as well.  No ecchymosis, deformities, or decreased range of motion appreciated on exam.  Patient was able to ambulate into the exam room on his own.  Distal pulses were 2+ bilaterally. Ibuprofen and milligrams was given for pain.  On reevaluation patient reports improvement of symptoms.   X-rays of the left wrist, knee, and ankle were obtained.  No acute dislocations or fractures were present on exam.   ASO ankle brace and crutches provided.  Patient instructed to bear weight as tolerated as well as using the crutches as needed.  A prescription for Ibuprofen provided at time of discharge and information for orthopedic surgeon given for further evaluation and management of left ankle sprain.  Strict return precautions provided.  Patient stable and safe for discharge home.       Final Clinical Impression(s) / ED Diagnoses Final diagnoses:  Sprain of left ankle, unspecified ligament, initial encounter    Rx / DC Orders ED Discharge Orders          Ordered    ibuprofen (ADVIL) 800 MG tablet  3 times daily        07/04/22 0859              Maxwell Marion, PA-C 07/04/22 1633    Derwood Kaplan, MD 07/05/22 [phone]

## 2022-07-04 NOTE — ED Provider Triage Note (Signed)
Emergency Medicine Provider Triage Evaluation Note  Robert Anthony , a 22 y.o. male  was evaluated in triage.  Pt complains of left knee pain after fall.  Review of Systems  Positive: Left knee and left wrist pain Negative: LOC, decreased ROM  Physical Exam  BP 123/84 (BP Location: Right Arm)   Pulse 95   Temp 98.3 F (36.8 C) (Oral)   Resp 16   Ht 5\' 11"  (1.803 m)   Wt 113.4 kg   SpO2 99%   BMI 34.87 kg/m  Gen:   Awake, no distress   Resp:  Normal effort  MSK:   Moves extremities without difficulty,  Other:    Medical Decision Making  Medically screening exam initiated at 7:29 AM.  Appropriate orders placed.  Robert Anthony was informed that the remainder of the evaluation will be completed by another provider, this initial triage assessment does not replace that evaluation, and the importance of remaining in the ED until their evaluation is complete.     Maxwell Marion, PA-C 07/04/22 1649

## 2022-07-04 NOTE — Discharge Instructions (Addendum)
You have a left ankle sprain. Wear your ankle brace until you are evaluated by orthopedics. You can be weight-baring as tolerated. Rest, ice, use compression, and elevate your left ankle for symptom relief. Ibuprofen 800mg  prescription sent to your pharmacy. Take 1 tablet every 8 hours as needed for pain and swelling.   Call Dr. Kyra Leyland office to make an appointment for left ankle sprain reevaluation.  Get help right away if: Your foot or toes are numb or blue. You have very bad pain that gets worse.

## 2023-01-11 ENCOUNTER — Encounter (HOSPITAL_COMMUNITY): Payer: Self-pay

## 2023-01-11 ENCOUNTER — Other Ambulatory Visit: Payer: Self-pay

## 2023-01-11 ENCOUNTER — Emergency Department (HOSPITAL_COMMUNITY)
Admission: EM | Admit: 2023-01-11 | Discharge: 2023-01-12 | Disposition: A | Discharge status: Home / Self Care | Payer: Medicaid Other | Attending: Emergency Medicine | Admitting: Emergency Medicine

## 2023-01-11 DIAGNOSIS — R161 Splenomegaly, not elsewhere classified: Secondary | ICD-10-CM | POA: Insufficient documentation

## 2023-01-11 DIAGNOSIS — R112 Nausea with vomiting, unspecified: Secondary | ICD-10-CM | POA: Insufficient documentation

## 2023-01-11 DIAGNOSIS — R197 Diarrhea, unspecified: Secondary | ICD-10-CM | POA: Diagnosis not present

## 2023-01-11 DIAGNOSIS — K644 Residual hemorrhoidal skin tags: Secondary | ICD-10-CM | POA: Insufficient documentation

## 2023-01-11 DIAGNOSIS — Z23 Encounter for immunization: Secondary | ICD-10-CM | POA: Insufficient documentation

## 2023-01-11 DIAGNOSIS — R7989 Other specified abnormal findings of blood chemistry: Secondary | ICD-10-CM | POA: Insufficient documentation

## 2023-01-11 DIAGNOSIS — R1030 Lower abdominal pain, unspecified: Secondary | ICD-10-CM

## 2023-01-11 LAB — CBC
HCT: 47.8 % (ref 39.0–52.0)
Hemoglobin: 17.8 g/dL — ABNORMAL HIGH (ref 13.0–17.0)
MCH: 32.8 pg (ref 26.0–34.0)
MCHC: 37.2 g/dL — ABNORMAL HIGH (ref 30.0–36.0)
MCV: 88.2 fL (ref 80.0–100.0)
Platelets: 137 10*3/uL — ABNORMAL LOW (ref 150–400)
RBC: 5.42 MIL/uL (ref 4.22–5.81)
RDW: 11.5 % (ref 11.5–15.5)
WBC: 6.9 10*3/uL (ref 4.0–10.5)
nRBC: 0 % (ref 0.0–0.2)

## 2023-01-11 LAB — URINALYSIS, ROUTINE W REFLEX MICROSCOPIC
Bacteria, UA: NONE SEEN
Bilirubin Urine: NEGATIVE
Glucose, UA: NEGATIVE mg/dL
Hgb urine dipstick: NEGATIVE
Ketones, ur: NEGATIVE mg/dL
Leukocytes,Ua: NEGATIVE
Nitrite: NEGATIVE
Protein, ur: 30 mg/dL — AB
Specific Gravity, Urine: 1.028 (ref 1.005–1.030)
pH: 5 (ref 5.0–8.0)

## 2023-01-11 LAB — COMPREHENSIVE METABOLIC PANEL
ALT: 77 U/L — ABNORMAL HIGH (ref 0–44)
AST: 57 U/L — ABNORMAL HIGH (ref 15–41)
Albumin: 4.1 g/dL (ref 3.5–5.0)
Alkaline Phosphatase: 128 U/L — ABNORMAL HIGH (ref 38–126)
Anion gap: 12 (ref 5–15)
BUN: 13 mg/dL (ref 6–20)
CO2: 21 mmol/L — ABNORMAL LOW (ref 22–32)
Calcium: 9.6 mg/dL (ref 8.9–10.3)
Chloride: 105 mmol/L (ref 98–111)
Creatinine, Ser: 0.96 mg/dL (ref 0.61–1.24)
GFR, Estimated: >60 mL/min (ref >60)
Glucose, Bld: 94 mg/dL (ref 70–99)
Potassium: 3.4 mmol/L — ABNORMAL LOW (ref 3.5–5.1)
Sodium: 138 mmol/L (ref 135–145)
Total Bilirubin: 1.7 mg/dL — ABNORMAL HIGH (ref 0.0–1.2)
Total Protein: 7.9 g/dL (ref 6.5–8.1)

## 2023-01-11 LAB — LIPASE, BLOOD: Lipase: 20 U/L (ref 11–51)

## 2023-01-11 MED ORDER — ONDANSETRON HCL 4 MG/2ML IJ SOLN: 4.0000 mg | Freq: Once | INTRAMUSCULAR | Status: DC | PRN

## 2023-01-11 NOTE — ED Triage Notes (Addendum)
 Patient BIB [facility] from home for N/V/D x 3 days. Patient's girlfriend's pet rat bit him x 3 days ago, finger does not appear infected at this time. Patient reports last tdap was >10 years ago. HR 110, BP 148/90, 95% RA, CBG 107 EMS gave 4mg  zofran, 20 L AC

## 2023-01-11 NOTE — ED Provider Triage Note (Signed)
 Emergency Medicine Provider Triage Evaluation Note  Robert Anthony , a 23 y.o. male  was evaluated in triage.  Pt complains of fever, vomiting,ULQ abdominal pain since 3 days ago and 4 episodes of bloody diarrhea since this morning. Tmax 102.4F.  Chest pain started 3-4 hours ago and  Intermittent shortness of breath, both happen during the episode of bloody diarrhea.   Unable to tolerate food. Has been able to drink water.  Has taken tylenol . No recent abx.  Review of Systems  Positive: Headache Negative: Dizziness, vertigo, vision changes, numbness, tingling.   Physical Exam  BP 133/74   Pulse 92   Temp 99.9 F (37.7 C)   Resp 16   Ht 5' 11 (1.803 m)   Wt 106.6 kg   SpO2 96%   BMI 32.78 kg/m  Gen:   Awake, no distress   Resp:  Normal effort  MSK:   Moves extremities without difficulty  Other:    Medical Decision Making  Medically screening exam initiated at 9:02 PM.  Appropriate orders placed.  Juliene LITTIE Gaddie was informed that the remainder of the evaluation will be completed by another provider, this initial triage assessment does not replace that evaluation, and the importance of remaining in the ED until their evaluation is complete.     Beola Terrall RAMAN, [place] 01/11/23 2108

## 2023-01-11 NOTE — ED Notes (Signed)
Patient given specimen cup for stool sample.

## 2023-01-12 ENCOUNTER — Emergency Department (HOSPITAL_COMMUNITY): Payer: Medicaid Other

## 2023-01-12 LAB — MONONUCLEOSIS SCREEN: Mono Screen: NEGATIVE

## 2023-01-12 LAB — I-STAT CG4 LACTIC ACID, ED: Lactic Acid, Venous: 0.8 mmol/L (ref 0.5–1.9)

## 2023-01-12 MED ORDER — TETANUS-DIPHTH-ACELL PERTUSSIS 5-2.5-18.5 LF-MCG/0.5 IM SUSY
PREFILLED_SYRINGE | INTRAMUSCULAR | Status: AC
  Filled 2023-01-12: qty 0.5

## 2023-01-12 MED ORDER — IOHEXOL 350 MG/ML SOLN
75.0000 mL | Freq: Once | INTRAVENOUS | Status: AC | PRN
  Administered 2023-01-12: 75 mL via INTRAVENOUS

## 2023-01-12 MED ORDER — LACTATED RINGERS IV BOLUS
1000.0000 mL | Freq: Once | INTRAVENOUS | Status: AC
  Administered 2023-01-12: 1000 mL via INTRAVENOUS

## 2023-01-12 MED ORDER — TETANUS-DIPHTH-ACELL PERTUSSIS 5-2.5-18.5 LF-MCG/0.5 IM SUSY
0.5000 mL | PREFILLED_SYRINGE | Freq: Once | INTRAMUSCULAR | Status: AC
  Administered 2023-01-12: 0.5 mL via INTRAMUSCULAR
  Filled 2023-01-12: qty 0.5

## 2023-01-12 MED ORDER — ONDANSETRON HCL 4 MG PO TABS: 4.0000 mg | ORAL_TABLET | Freq: Four times a day (QID) | ORAL | 0 refills | Status: AC | PRN

## 2023-01-12 NOTE — ED Provider Notes (Signed)
 [place] EMERGENCY DEPARTMENT AT [facility] Provider Note   CSN: [id number] Arrival date & time: 01/11/23  2011     History Chief Complaint  Patient presents with   Emesis   Nausea    Robert Anthony is a 23 y.o. male reportedly otherwise healthy presents to the ER today for evaluation of lower abdominal pain with nausea and vomiting.  Patient reports has been having between 2 episodes per day, 5 episodes altogether over the past 3 days.  Reports has had greater than 10 episodes of diarrhea per day.  Started to have some bright red blood per rectum only yesterday and was only occasionally with wiping with some bowel movements.  There was no blood in the toilet or surrounding the stool.  He reports he also developed a fever and was 102.3F yesterday but has not had any fever since.  Denies any chills.  He denies any hematemesis or coffee-ground emesis.  Denies any runny nose, nasal congestion, or cough.  Denies any dysuria or hematuria.  He reports that he took some Imodium and has not had any diarrhea recently.  He cannot remove what he ate previous that would have caused this.  Additionally, he was bit by a rat a week ago.  The rat is up-to-date on vaccinations and is a pet.  Unknown when patient's last tetanus was but thinks was greater than 10 years ago. NKDA.  Denies any tobacco, EtOH, other drug use.   Emesis Associated symptoms: diarrhea and fever   Associated symptoms: no chills, no cough and no sore throat        Home Medications Prior to Admission medications   Medication Sig Start Date End Date Taking? Authorizing Provider  amitriptyline  (ELAVIL ) 25 MG tablet Take 1 tablet (25 mg total) by mouth at bedtime. Patient not taking: No sig reported 07/18/14   Corinthia Blossom, MD  amphetamine-dextroamphetamine (ADDERALL) 5 MG tablet Take 1 tablet by mouth every evening. Patient not taking: Reported on 07/10/2020 06/28/14   [provider]  hydrochlorothiazide   (HYDRODIURIL ) 25 MG tablet Take 1 tablet (25 mg total) by mouth daily. Patient not taking: Reported on 07/10/2020 10/30/17   Loetta Senior, MD      Allergies    Patient has no known allergies.    Review of Systems   Review of Systems  Constitutional:  Positive for fever. Negative for chills.  HENT:  Negative for congestion, rhinorrhea and sore throat.   Respiratory:  Negative for cough and shortness of breath.   Cardiovascular:  Negative for chest pain.  Gastrointestinal:  Positive for anal bleeding, diarrhea, nausea and vomiting. Negative for blood in stool and constipation.  Genitourinary:  Negative for dysuria, hematuria, penile discharge, penile pain, penile swelling, scrotal swelling and testicular pain.    Physical Exam Updated Vital Signs BP 128/79   Pulse 87   Temp 99.2 F (37.3 C)   Resp 16   Ht 5' 11 (1.803 m)   Wt 106.6 kg   SpO2 98%   BMI 32.78 kg/m  Physical Exam Vitals and nursing note reviewed. Exam conducted with a chaperone present [name], [place]).  Constitutional:      General: He is not in acute distress.    Appearance: He is not ill-appearing or toxic-appearing.  Eyes:     General: No scleral icterus. Cardiovascular:     Rate and Rhythm: Normal rate.  Pulmonary:     Effort: No respiratory distress.     Breath sounds: Normal breath  sounds.  Abdominal:     Palpations: Abdomen is soft.     Tenderness: There is abdominal tenderness. There is no right CVA tenderness, left CVA tenderness, guarding or rebound.  Genitourinary:      Comments: Small hemorrhoid present.  No blood on DRE. Skin:    General: Skin is warm and dry.  Neurological:     General: No focal deficit present.     Mental Status: He is alert.     ED Results / Procedures / Treatments   Labs (all labs ordered are listed, but only abnormal results are displayed) Labs Reviewed  COMPREHENSIVE METABOLIC PANEL - Abnormal; Notable for the following components:      Result Value    Potassium 3.4 (*)    CO2 21 (*)    AST 57 (*)    ALT 77 (*)    Alkaline Phosphatase 128 (*)    Total Bilirubin 1.7 (*)    All other components within normal limits  CBC - Abnormal; Notable for the following components:   Hemoglobin 17.8 (*)    MCHC 37.2 (*)    Platelets 137 (*)    All other components within normal limits  URINALYSIS, ROUTINE W REFLEX MICROSCOPIC - Abnormal; Notable for the following components:   Color, Urine AMBER (*)    Protein, ur 30 (*)    All other components within normal limits  GASTROINTESTINAL PANEL BY PCR, STOOL (REPLACES STOOL CULTURE)  CULTURE, BLOOD (ROUTINE X 2)  CULTURE, BLOOD (ROUTINE X 2)  LIPASE, BLOOD  MONONUCLEOSIS SCREEN  I-STAT CG4 LACTIC ACID, ED  I-STAT CG4 LACTIC ACID, ED    EKG EKG Interpretation Date/Time:  Monday January 11 2023 20:23:08 EST Ventricular Rate:  107 PR Interval:  152 QRS Duration:  86 QT Interval:  324 QTC Calculation: 432 R Axis:   71  Text Interpretation: Sinus tachycardia Confirmed by Melvenia Motto ([id number]) on 01/12/2023 1:54:40 AM  Radiology CT ABDOMEN PELVIS W CONTRAST Result Date: 01/12/2023 CLINICAL DATA:  Abdominal pain, vomiting, fever EXAM: CT ABDOMEN AND PELVIS WITH CONTRAST TECHNIQUE: Multidetector CT imaging of the abdomen and pelvis was performed using the standard protocol following bolus administration of intravenous contrast. RADIATION DOSE REDUCTION: This exam was performed according to the departmental dose-optimization program which includes automated exposure control, adjustment of the mA and/or kV according to patient size and/or use of iterative reconstruction technique. CONTRAST:  75mL OMNIPAQUE  IOHEXOL  350 MG/ML SOLN COMPARISON:  05/17/2013 FINDINGS: Lower chest: No acute abnormality Hepatobiliary: No focal hepatic abnormality. Gallbladder unremarkable. Pancreas: No focal abnormality or ductal dilatation. Spleen: No focal abnormality. Mild splenomegaly with a craniocaudal length of 15 cm  Adrenals/Urinary Tract: No adrenal abnormality. No focal renal abnormality. No stones or hydronephrosis. Urinary bladder is unremarkable. Stomach/Bowel: Normal appendix. Stomach, large and small bowel grossly unremarkable. Vascular/Lymphatic: No evidence of aneurysm or adenopathy. Reproductive: No visible focal abnormality. Other: No free fluid or free air. Musculoskeletal: No acute bony abnormality. IMPRESSION: Splenomegaly. Otherwise no acute findings in the abdomen or pelvis. Electronically Signed   By: Franky Crease M.D.   On: 01/12/2023 01:29    Procedures Procedures   Medications Ordered in ED Medications  ondansetron  (ZOFRAN ) injection 4 mg (has no administration in time range)  lactated ringers  bolus 1,000 mL (1,000 mLs Intravenous New Bag/Given 01/12/23 0313)  iohexol  (OMNIPAQUE ) 350 MG/ML injection 75 mL (75 mLs Intravenous Contrast Given 01/12/23 0118)    ED Course/ Medical Decision Making/ A&P    Medical Decision Making Amount and/or Complexity  of Data Reviewed Labs: ordered. Radiology: ordered.  Risk Prescription drug management.   23 y.o. male presents to the ER for evaluation of abdominal pain, NVD, and BRBPR. Differential diagnosis includes but is not limited to AAA, mesenteric ischemia, appendicitis, diverticulitis, DKA, gastroenteritis, nephrolithiasis, pancreatitis, constipation, UTI, bowel obstruction, biliary disease, IBD, PUD, hepatitis. Vital signs mildly elevated temperature nine 9.9 otherwise unremarkable. Physical exam as noted above.   I independently reviewed and interpreted the patient's labs.  Urinalysis shows amber urine with 30 protein otherwise unremarkable.  Lipase at 20.  CBC shows concentrated hemoglobin which appears around patient's baseline.  Also has some thrombocytopenia 137, again appears chronic.  Lactic acid within normal limits.  Mono negative.  CMP shows mildly decreased potassium 3 and 4.  Bicarb 21.  Has slight elevation to AST, ALT, alk phos,  total bili.  GI PCR ordered however patient was unable to produce a bowel movement while here for 11 hours.   CT shows splenomegaly. Otherwise no acute findings in the abdomen or pelvis.   I added on the monitor the patient splenomegaly although he does not have any other cough or cold symptoms.  Mono is negative.  Patient seems to chronically have slight thrombocytopenia as well as slightly elevated hemoglobin.  May have some hematologic chronic/congenital disorder.  Will recommend that he follow-up with hematology.  Patient has been here for 11 hours as failed to produce a bowel movement.  He does not have any frank red blood on examination.  He is only having these episodes intermittently and is only with wiping.  Likely has some irritation due to his multiple diarrheal events.  He does also have a small hemorrhoid as well.  There was no gross red blood on digital rectal exam.  His hemoglobin is at its chronic state.  I discussed this case with my attending.  Concern given the patient's fever as well as occasional bloody diarrhea as well as elevated LFTs. Questioned ordering a RUQ US  given the LFTS, but the patient does not having any upper pain. Attending did not see this necessary.  The patient does not have any significant leukocytosis.  He has had elevated temperatures but no recorded fever here.  His blood counts had a stable.  Does not recommend any antibiotics.  Do recommend that he take a cab home to see if he can provide us  was able to a primary care doctor or GI provider.  I did place a urgent GI ambulatory referral as well as amatory referral to hematology.  Ultimately, likely has some viral illness however would like to rule out any bacterial GI source.  I discussed the need to provide the sample of stool with a provider to the patient.  He reports that he is feeling much better after the fluids.  I did update his tetanus.  He has a very small healed scab to his right finger.  There is no  overlying erythema or warmth.  No purulence.  No red streaking.  Does not appear infected.  They assure me that the rat is up-to-date on its rabies vaccinations.  This rat bite was also 10 to 14 days ago and the wound is already healed.  We discussed the results of the labs/imaging. The plan is take medications as prescribed, follow with hematology, follow-up with gastroenterology. We discussed strict return precautions and red flag symptoms. The patient verbalized their understanding and agrees to the plan. The patient is stable and being discharged home in good condition.  Portions of this report may have been transcribed using voice recognition software. Every effort was made to ensure accuracy; however, inadvertent computerized transcription errors may be present.   I discussed this case with my attending physician who cosigned this note including patient's presenting symptoms, physical exam, and planned diagnostics and interventions. Attending physician stated agreement with plan or made changes to plan which were implemented.   Final Clinical Impression(s) / ED Diagnoses Final diagnoses:  Splenomegaly  Elevated LFTs  Lower abdominal pain    Rx / DC Orders ED Discharge Orders          Ordered    Ambulatory referral to Hematology / Oncology       Comments: Splenomegaly, elevated hemoglobin, thrombocytopenia   01/12/23 0604    Ambulatory referral to Gastroenterology        01/12/23 0604    ondansetron  (ZOFRAN ) 4 MG tablet  Every 6 hours PRN        01/12/23 0610              Bernis Ernst, PA-C 01/13/23 0759    Melvenia Motto, MD 01/18/23 1606

## 2023-01-12 NOTE — Discharge Instructions (Addendum)
 You were seen today for evaluation of your abdominal pain with nausea vomiting diarrhea.  Your CT scan showed that she had a mildly enlarged spleen otherwise unremarkable.  No contact sports or placing yourself in situations where you can be injured.  You will need to see hematologist for this.  Again, you will need to provide a sample of your stool for testing given that you have fever with diarrhea.  Please make sure you bring this into her primary care office or GI office.  They should reach out to the next few days call for an appointment however if you not hear from them, please call to schedule.  Please make sure that you follow up with the hematologist, gastroenterologist, and primary care provider. If you have not heard back from them in a few days, please make sure to call to schedule. You will need to bring in a sample or your stool for testing.  If you do not feel better within the next 48 hours or so to feel worse, please return to the ER for evaluation.  Please make sure you are staying well-hydrated drink plenty of fluids, mainly water.  Try a bland diet as well.  Included more information on these the discharge report.  Please review.  If you have any concerns, new or worsening symptoms, please return to the nearest return for evaluation.  Contact a doctor if: Your belly pain changes or gets worse. You have very bad cramping or bloating in your belly. You vomit. Your pain gets worse with meals, after eating, or with certain foods. You have trouble pooping or have watery poop for more than 2-3 days. You are not hungry, or you lose weight without trying. You have signs of not getting enough fluid or water (dehydration). These may include: Dark pee, very little pee, or no pee. Cracked lips or dry mouth. Feeling sleepy or weak. You have pain when you pee or poop. Your belly pain wakes you up at night. You have blood in your pee. You have a fever. Get help right away if: You cannot stop  vomiting. Your pain is only in one part of your belly, like on the right side. You have bloody or black poop, or poop that looks like tar. You have trouble breathing. You have chest pain. These symptoms may be an emergency. Get help right away. Call 911. Do not wait to see if the symptoms will go away. Do not drive yourself to the hospital.

## 2023-01-17 LAB — CULTURE, BLOOD (ROUTINE X 2)
Culture: NO GROWTH
Culture: NO GROWTH
Special Requests: ADEQUATE
Special Requests: ADEQUATE

## 2023-02-23 ENCOUNTER — Inpatient Hospital Stay: Payer: Medicaid Other

## 2023-02-23 ENCOUNTER — Inpatient Hospital Stay: Payer: Medicaid Other | Attending: Oncology | Admitting: Oncology

## 2023-02-23 ENCOUNTER — Telehealth: Payer: Self-pay

## 2023-02-23 NOTE — Telephone Encounter (Signed)
 Spoke with patient about his missed 2pm appointment today. He stated he forgot and asked for a new appointment. Kela, scheduler, made aware to reschedule.

## 2023-02-23 NOTE — Telephone Encounter (Signed)
 Tried to call patient to reschedule labs, and appointment with Dr. Arlana Pouch on 02/23/23 @ 2:31pM. No answer and voicemail box was full. Per Dr. Arlana Pouch if patient returns the call schedule appointment within the next 3-4 weeks (non-urgent consult.)

## 2023-06-14 ENCOUNTER — Emergency Department (HOSPITAL_COMMUNITY): Payer: Worker's Compensation

## 2023-06-14 ENCOUNTER — Emergency Department (HOSPITAL_COMMUNITY)
Admission: EM | Admit: 2023-06-14 | Discharge: 2023-06-14 | Disposition: A | Discharge status: Home / Self Care | Payer: Worker's Compensation | Attending: Emergency Medicine | Admitting: Emergency Medicine

## 2023-06-14 ENCOUNTER — Encounter (HOSPITAL_COMMUNITY): Payer: Self-pay

## 2023-06-14 ENCOUNTER — Other Ambulatory Visit: Payer: Self-pay

## 2023-06-14 DIAGNOSIS — Y990 Civilian activity done for income or pay: Secondary | ICD-10-CM | POA: Insufficient documentation

## 2023-06-14 DIAGNOSIS — I10 Essential (primary) hypertension: Secondary | ICD-10-CM | POA: Insufficient documentation

## 2023-06-14 DIAGNOSIS — J45909 Unspecified asthma, uncomplicated: Secondary | ICD-10-CM | POA: Insufficient documentation

## 2023-06-14 DIAGNOSIS — W108XXA Fall (on) (from) other stairs and steps, initial encounter: Secondary | ICD-10-CM | POA: Insufficient documentation

## 2023-06-14 DIAGNOSIS — W19XXXA Unspecified fall, initial encounter: Secondary | ICD-10-CM

## 2023-06-14 DIAGNOSIS — S0083XA Contusion of other part of head, initial encounter: Secondary | ICD-10-CM | POA: Insufficient documentation

## 2023-06-14 DIAGNOSIS — S93402A Sprain of unspecified ligament of left ankle, initial encounter: Secondary | ICD-10-CM | POA: Insufficient documentation

## 2023-06-14 DIAGNOSIS — S8392XA Sprain of unspecified site of left knee, initial encounter: Secondary | ICD-10-CM | POA: Insufficient documentation

## 2023-06-14 DIAGNOSIS — Y9301 Activity, walking, marching and hiking: Secondary | ICD-10-CM | POA: Insufficient documentation

## 2023-06-14 MED ORDER — OXYCODONE-ACETAMINOPHEN 5-325 MG PO TABS
1.0000 | ORAL_TABLET | Freq: Once | ORAL | Status: AC
  Administered 2023-06-14: 1 via ORAL
  Filled 2023-06-14: qty 1

## 2023-06-14 NOTE — ED Provider Notes (Signed)
 [place] EMERGENCY DEPARTMENT AT [facility] Provider Note   CSN: [id number] Arrival date & time: 06/14/23  0431     History  Chief Complaint  Patient presents with   Robert Anthony is a 23 y.o. male.   Fall Associated symptoms include headaches.  Patient presents after a fall.  Medical history includes asthma, HTN, migraines, ADHD.  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  He lost his balance and fell down approximately 3 steps.  He was able to catch himself.  He had some pain in his left ankle and left knee.  As he was walking down the rest of the steps, his left knee pain caused him to have another fall.  He did strike his head.  Currently, he endorses pain in head, left knee, left ankle but denies any other areas of discomfort.     Home Medications Prior to Admission medications   Medication Sig Start Date End Date Taking? Authorizing Provider  amitriptyline  (ELAVIL ) 25 MG tablet Take 1 tablet (25 mg total) by mouth at bedtime. Patient not taking: No sig reported 07/18/14   Ventura Gins, MD  amphetamine-dextroamphetamine (ADDERALL) 5 MG tablet Take 1 tablet by mouth every evening. Patient not taking: Reported on 07/10/2020 06/28/14   [provider]  hydrochlorothiazide  (HYDRODIURIL ) 25 MG tablet Take 1 tablet (25 mg total) by mouth daily. Patient not taking: Reported on 07/10/2020 10/30/17   Bart Born, MD  ondansetron  (ZOFRAN ) 4 MG tablet Take 1 tablet (4 mg total) by mouth every 6 (six) hours as needed for nausea or vomiting. 01/12/23   Spence Dux, PA-C      Allergies    Patient has no known allergies.    Review of Systems   Review of Systems  Musculoskeletal:  Positive for arthralgias.  Neurological:  Positive for headaches.  All other systems reviewed and are negative.   Physical Exam Updated Vital Signs BP (!) 153/86 (BP Location: Right Arm)   Pulse 100   Temp 98 F  (36.7 C) (Oral)   Resp 20   Ht 5\' 11"  (1.803 m)   Wt 104.3 kg   SpO2 100%   BMI 32.08 kg/m  Physical Exam Vitals and nursing note reviewed.  Constitutional:      General: He is not in acute distress.    Appearance: Normal appearance. He is well-developed. He is not ill-appearing, toxic-appearing or diaphoretic.  HENT:     Head: Normocephalic.     Comments: Left forehead hematoma    Right Ear: External ear normal.     Left Ear: External ear normal.     Nose: Nose normal.     Mouth/Throat:     Mouth: Mucous membranes are moist.  Eyes:     Extraocular Movements: Extraocular movements intact.     Conjunctiva/sclera: Conjunctivae normal.  Cardiovascular:     Rate and Rhythm: Normal rate and regular rhythm.  Pulmonary:     Effort: Pulmonary effort is normal. No respiratory distress.  Chest:     Chest wall: No tenderness.  Abdominal:     General: There is no distension.     Palpations: Abdomen is soft.     Tenderness: There is no abdominal tenderness.  Musculoskeletal:        General: No swelling or deformity.     Cervical back: Normal range of motion and neck supple.  Skin:    General: Skin is  warm and dry.     Capillary Refill: Capillary refill takes less than 2 seconds.     Coloration: Skin is not jaundiced or pale.  Neurological:     General: No focal deficit present.     Mental Status: He is alert and oriented to person, place, and time.     Cranial Nerves: No cranial nerve deficit.     Sensory: No sensory deficit.     Motor: No weakness.     Coordination: Coordination normal.  Psychiatric:        Mood and Affect: Mood normal.        Behavior: Behavior normal.     ED Results / Procedures / Treatments   Labs (all labs ordered are listed, but only abnormal results are displayed) Labs Reviewed - No data to display  EKG None  Radiology CT Cervical Spine Wo Contrast Result Date: 06/14/2023 CLINICAL DATA:  24 year old male status post fall down stairs. Struck  left head with swelling. EXAM: CT CERVICAL SPINE WITHOUT CONTRAST TECHNIQUE: Multidetector CT imaging of the cervical spine was performed without intravenous contrast. Multiplanar CT image reconstructions were also generated. RADIATION DOSE REDUCTION: This exam was performed according to the departmental dose-optimization program which includes automated exposure control, adjustment of the mA and/or kV according to patient size and/or use of iterative reconstruction technique. COMPARISON:  Head CT without contrast today. FINDINGS: Alignment: Mild straightening of cervical lordosis. Cervicothoracic junction alignment is within normal limits. Bilateral posterior element alignment is within normal limits. Skull base and vertebrae: Bone mineralization is within normal limits. Visualized skull base is intact. No atlanto-occipital dissociation. C1 and C2 appear intact and aligned. No acute osseous abnormality identified. Soft tissues and spinal canal: No prevertebral fluid or swelling. No visible canal hematoma. Negative visible noncontrast neck soft tissues. Disc levels: Multilevel cervical disc space loss, degeneration with asymmetric foraminal disc osteophyte complex. Mild spinal stenosis is possible at C3-C4. Moderate multilevel cervical neural foraminal stenosis. Upper chest: Visible upper thoracic levels appear intact, negative noncontrast visible thoracic inlet. IMPRESSION: 1. No acute traumatic injury identified in the cervical spine. 2. But advanced for age cervical spine disc and endplate degeneration. This is mostly foraminal disc osteophyte spurring. But mild cervical spinal stenosis is possible at C3-C4. Electronically Signed   By: Marlise Simpers M.D.   On: 06/14/2023 06:14   CT Head Wo Contrast Result Date: 06/14/2023 CLINICAL DATA:  23 year old male status post fall down stairs. Struck left head with swelling. EXAM: CT HEAD WITHOUT CONTRAST TECHNIQUE: Contiguous axial images were obtained from the base of the  skull through the vertex without intravenous contrast. RADIATION DOSE REDUCTION: This exam was performed according to the departmental dose-optimization program which includes automated exposure control, adjustment of the mA and/or kV according to patient size and/or use of iterative reconstruction technique. COMPARISON:  Head CT 02/23/2014. FINDINGS: Brain: Normal cerebral volume. No midline shift, ventriculomegaly, mass effect, evidence of mass lesion, intracranial hemorrhage or evidence of cortically based acute infarction. Gray-white matter differentiation is within normal limits throughout the brain. Vascular: No suspicious intracranial vascular hyperdensity. Skull: Stable and intact. Sinuses/Orbits: Visualized paranasal sinuses and mastoids are stable and well aerated. No layering fluid or hemorrhage. Other: Left forehead broad-based scalp hematoma or contusion series 4, image 52. Underlying left frontal bone and frontal sinus appear intact. No scalp soft tissue gas. Visualized orbit soft tissues are within normal limits. IMPRESSION: 1. Left forehead scalp soft tissue injury with no skull fracture. 2. Normal noncontrast CT appearance of  the brain. Electronically Signed   By: Marlise Simpers M.D.   On: 06/14/2023 06:12   DG Ankle 2 Views Left Result Date: 06/14/2023 CLINICAL DATA:  Fall down stairs. EXAM: LEFT ANKLE - 2 VIEW COMPARISON:  None Available. FINDINGS: There is no evidence of fracture, dislocation, or joint effusion. There is no evidence of arthropathy or other focal bone abnormality. Soft tissues are unremarkable. IMPRESSION: Negative. Electronically Signed   By: Donnal Fusi M.D.   On: 06/14/2023 05:21   DG Chest Portable 1 View Result Date: 06/14/2023 CLINICAL DATA:  Fall down stairs.  Trauma. EXAM: PORTABLE CHEST 1 VIEW COMPARISON:  07/10/2020 FINDINGS: The lungs are clear without focal pneumonia, edema, pneumothorax or pleural effusion. The cardiopericardial silhouette is within normal limits for  size. No acute bony abnormality. Telemetry leads overlie the chest. IMPRESSION: No active disease. Electronically Signed   By: Donnal Fusi M.D.   On: 06/14/2023 05:21   DG Pelvis Portable Result Date: 06/14/2023 CLINICAL DATA:  Trauma.  Fall. EXAM: PORTABLE PELVIS 1-2 VIEWS COMPARISON:  None Available. FINDINGS: There is no evidence of pelvic fracture or diastasis. No pelvic bone lesions are seen. IMPRESSION: Negative. Electronically Signed   By: Donnal Fusi M.D.   On: 06/14/2023 05:20   DG Knee Left Port Result Date: 06/14/2023 CLINICAL DATA:  Fall with knee pain. EXAM: PORTABLE LEFT KNEE - 1-2 VIEW COMPARISON:  05/10/2021 FINDINGS: No evidence of fracture, dislocation, or joint effusion. No evidence of arthropathy or other focal bone abnormality. Soft tissues are unremarkable. IMPRESSION: Negative. Electronically Signed   By: Donnal Fusi M.D.   On: 06/14/2023 05:19    Procedures Procedures    Medications Ordered in ED Medications  oxyCODONE-acetaminophen  (PERCOCET/ROXICET) 5-325 MG per tablet 1 tablet (1 tablet Oral Given 06/14/23 0506)    ED Course/ Medical Decision Making/ A&P                                 Medical Decision Making Amount and/or Complexity of Data Reviewed Radiology: ordered.  Risk Prescription drug management.   Patient presenting after mechanical fall. Vital signs on arrival are notable for hypertension.  He is well-appearing on exam.  He describes 2 subsequent falls, both mechanical in nature, while walking down steps.  He endorses pain in left knee, left ankle, and head.  Secondary exam notable for small hematoma to left forehead.  There is no appreciable swelling or deformity to left knee or ankle.  He is able to flex and extend both joints.  Knee flexion does worsen anterior knee pain.  Percocet was ordered for analgesia.  Imaging studies were ordered.  These were negative for acute osseous or intracranial injuries.  Ace wrap's were placed on ankle and knee.   He was advised to utilize RICE therapy and take ibuprofen  and Tylenol  as needed.  Patient was discharged in stable condition.        Final Clinical Impression(s) / ED Diagnoses Final diagnoses:  Fall, initial encounter  Sprain of left knee, unspecified ligament, initial encounter  Sprain of left ankle, unspecified ligament, initial encounter    Rx / DC Orders ED Discharge Orders     None         Iva Mariner, MD 06/14/23 [phone]

## 2023-06-14 NOTE — ED Notes (Signed)
 Patient transported to CT

## 2023-06-14 NOTE — ED Triage Notes (Signed)
 Patient BIB EMS from work. Patient was walking down stairs with a shaking railing and fell down 3 stairs. Patient then got up and to try to walk down the rest of the stairs and fell down 3 stairs again. Patient denies LOC and remembers everything. Denies N/V. Patient complains of L knee and ankle. Swelling to left side of forehead.

## 2023-06-14 NOTE — Discharge Instructions (Addendum)
 Take ibuprofen  and Tylenol  as needed for pain and soreness.  Rest, ice, compress, and elevate your left knee and ankle when possible.  If you have persistent discomfort beyond the next week, there is a telephone number below that you can call to set up a follow-up appointment with orthopedic surgery.

## 2023-09-19 ENCOUNTER — Emergency Department (HOSPITAL_COMMUNITY): Payer: Self-pay

## 2023-09-19 ENCOUNTER — Emergency Department (HOSPITAL_COMMUNITY)
Admission: EM | Admit: 2023-09-19 | Discharge: 2023-09-20 | Disposition: A | Discharge status: Home / Self Care | Payer: Self-pay | Attending: Emergency Medicine | Admitting: Emergency Medicine

## 2023-09-19 ENCOUNTER — Other Ambulatory Visit: Payer: Self-pay

## 2023-09-19 DIAGNOSIS — R079 Chest pain, unspecified: Secondary | ICD-10-CM | POA: Insufficient documentation

## 2023-09-19 DIAGNOSIS — R569 Unspecified convulsions: Secondary | ICD-10-CM | POA: Insufficient documentation

## 2023-09-19 DIAGNOSIS — J45909 Unspecified asthma, uncomplicated: Secondary | ICD-10-CM | POA: Insufficient documentation

## 2023-09-19 DIAGNOSIS — I10 Essential (primary) hypertension: Secondary | ICD-10-CM | POA: Insufficient documentation

## 2023-09-19 DIAGNOSIS — K921 Melena: Secondary | ICD-10-CM | POA: Insufficient documentation

## 2023-09-19 LAB — CBC
HCT: 46.3 % (ref 39.0–52.0)
Hemoglobin: 16.8 g/dL (ref 13.0–17.0)
MCH: 32.6 pg (ref 26.0–34.0)
MCHC: 36.3 g/dL — ABNORMAL HIGH (ref 30.0–36.0)
MCV: 89.9 fL (ref 80.0–100.0)
Platelets: 121 K/uL — ABNORMAL LOW (ref 150–400)
RBC: 5.15 MIL/uL (ref 4.22–5.81)
RDW: 11.2 % — ABNORMAL LOW (ref 11.5–15.5)
WBC: 6.2 K/uL (ref 4.0–10.5)
nRBC: 0 % (ref 0.0–0.2)

## 2023-09-19 LAB — COMPREHENSIVE METABOLIC PANEL WITH GFR
ALT: 47 U/L — ABNORMAL HIGH (ref 0–44)
AST: 34 U/L (ref 15–41)
Albumin: 4.1 g/dL (ref 3.5–5.0)
Alkaline Phosphatase: 100 U/L (ref 38–126)
Anion gap: 12 (ref 5–15)
BUN: 9 mg/dL (ref 6–20)
CO2: 19 mmol/L — ABNORMAL LOW (ref 22–32)
Calcium: 8.9 mg/dL (ref 8.9–10.3)
Chloride: 105 mmol/L (ref 98–111)
Creatinine, Ser: 0.95 mg/dL (ref 0.61–1.24)
GFR, Estimated: >60 mL/min (ref >60)
Glucose, Bld: 109 mg/dL — ABNORMAL HIGH (ref 70–99)
Potassium: 3.8 mmol/L (ref 3.5–5.1)
Sodium: 136 mmol/L (ref 135–145)
Total Bilirubin: 0.8 mg/dL (ref 0.0–1.2)
Total Protein: 5.8 g/dL — ABNORMAL LOW (ref 6.5–8.1)

## 2023-09-19 LAB — RAPID URINE DRUG SCREEN, HOSP PERFORMED
Amphetamines: NOT DETECTED
Barbiturates: NOT DETECTED
Benzodiazepines: NOT DETECTED
Cocaine: NOT DETECTED
Opiates: NOT DETECTED
Tetrahydrocannabinol: NOT DETECTED

## 2023-09-19 LAB — TROPONIN I (HIGH SENSITIVITY): Troponin I (High Sensitivity): <2 ng/L (ref <18)

## 2023-09-19 LAB — ETHANOL: Alcohol, Ethyl (B): <15 mg/dL (ref <15)

## 2023-09-19 MED ORDER — MORPHINE SULFATE (PF) 4 MG/ML IV SOLN: 4.0000 mg | Freq: Once | INTRAVENOUS | Status: DC

## 2023-09-19 MED ORDER — ONDANSETRON HCL 4 MG/2ML IJ SOLN: 4.0000 mg | Freq: Once | INTRAMUSCULAR | Status: DC

## 2023-09-19 NOTE — ED Provider Triage Note (Signed)
 Emergency Medicine Provider Triage Evaluation Note  Robert Anthony , a 23 y.o. male  was evaluated in triage.  Pt complains of left sided chest pain for around 1 hour. Was also driving when he became anxious and jittery and might have loss consciousness. Also endorsing left posterior leg pain intermittently. Endorses SOB as well.denies cough or fever.   Denies recent surgery/immobilization, hx DVT/PE, hemoptysis, hx cancer in the past 6 months.   Review of Systems  Positive:  Negative:   Physical Exam  BP 137/81 (BP Location: Right Arm)   Pulse 98   Temp 98.7 F (37.1 C)   Resp 16   Ht 5' 11 (1.803 m)   Wt 106.6 kg   SpO2 98%   BMI 32.78 kg/m  Gen:   Awake, no distress   Resp:  Normal effort  MSK:   Moves extremities without difficulty  Other:    Medical Decision Making  Medically screening exam initiated at 9:55 PM.  Appropriate orders placed.  Robert Anthony was informed that the remainder of the evaluation will be completed by another provider, this initial triage assessment does not replace that evaluation, and the importance of remaining in the ED until their evaluation is complete.     Robert Anthony, [place] 09/19/23 2158

## 2023-09-19 NOTE — ED Triage Notes (Addendum)
 EMS reports patient was driving home. When he arrived to his home he was feeling jittery and anxious. Wife states he may have had a syncopal episode. State he laid down and was not talking to her. Reports drinking soda and 1 meal. States he is having chest pain and he had blood in is stool 1 time. History of anxiety and depression. Never had a panic attack in the past. EMS gave 500ml of NS.

## 2023-09-20 ENCOUNTER — Emergency Department (HOSPITAL_COMMUNITY): Payer: Self-pay

## 2023-09-20 LAB — TROPONIN I (HIGH SENSITIVITY): Troponin I (High Sensitivity): 3 ng/L (ref <18)

## 2023-09-20 LAB — D-DIMER, QUANTITATIVE: D-Dimer, Quant: 0.32 ug{FEU}/mL (ref 0.00–0.50)

## 2023-09-20 MED ORDER — SODIUM CHLORIDE 0.9 % IV BOLUS
1000.0000 mL | Freq: Once | INTRAVENOUS | Status: AC
  Administered 2023-09-20: 1000 mL via INTRAVENOUS

## 2023-09-20 NOTE — Discharge Instructions (Addendum)
 Follow up with GI and neurology.  A neurology referral was placed through the ER. If you do not hear from the office, please call [facility] to schedule an appointment.  You will need to call GI in the morning to schedule follow up. Return to the ER for any worsening or concerning symptoms.   Per [place] statutes, patients with seizures are not allowed to drive until they have been seizure-free for six months.  Other recommendations include using caution when using heavy equipment or power tools. Avoid working on ladders or at heights. Take showers instead of baths.  Do not swim alone.  Ensure the water temperature is not too high on the home water heater. Do not go swimming alone. Do not lock yourself in a room alone (i.e. bathroom). When caring for infants or small children, sit down when holding, feeding, or changing them to minimize risk of injury to the child in the event you have a seizure. Maintain good sleep hygiene. Avoid alcohol.  Also recommend adequate sleep, hydration, good diet and minimize stress.     During the Seizure   - First, ensure adequate ventilation and place patients on the floor on their left side  Loosen clothing around the neck and ensure the airway is patent. If the patient is clenching the teeth, do not force the mouth open with any object as this can cause severe damage - Remove all items from the surrounding that can be hazardous. The patient may be oblivious to what's happening and may not even know what he or she is doing. If the patient is confused and wandering, either gently guide him/her away and block access to outside areas - Reassure the individual and be comforting - Call 911. In most cases, the seizure ends before EMS arrives. However, there are cases when seizures may last over 3 to 5 minutes. Or the individual may have developed breathing difficulties or severe injuries. If a pregnant patient or a person with diabetes develops a seizure,  it is prudent to call an ambulance. - Finally, if the patient does not regain full consciousness, then call EMS. Most patients will remain confused for about 45 to 90 minutes after a seizure, so you must use judgment in calling for help. - Avoid restraints but make sure the patient is in a bed with padded side rails - Place the individual in a lateral position with the neck slightly flexed; this will help the saliva drain from the mouth and prevent the tongue from falling backward - Remove all nearby furniture and other hazards from the area - Provide verbal assurance as the individual is regaining consciousness - Provide the patient with privacy if possible - Call for help and start treatment as ordered by the caregiver   After the Seizure (Postictal Stage)   After a seizure, most patients experience confusion, fatigue, muscle pain and/or a headache. Thus, one should permit the individual to sleep. For the next few days, reassurance is essential. Being calm and helping reorient the person is also of importance.   Most seizures are painless and end spontaneously. Seizures are not harmful to others but can lead to complications such as stress on the lungs, brain and the heart. Individuals with prior lung problems may develop labored breathing and respiratory distress.

## 2023-09-20 NOTE — ED Provider Notes (Signed)
 [place] EMERGENCY DEPARTMENT AT [facility] Provider Note   CSN: [id number] Arrival date & time: 09/19/23  2042     Patient presents with: Anxiety   Robert Anthony is a 23 y.o. male.   23 year old male brought in by EMS. Patient states he was feeling dizzy/headache onset Saturday and called out of work Saturday night. Sunday (9/14) he was driving when he started to feel unwell, got to family members house where they checked his blood sugar and it was normal. Patient drove home, stopped at a gas station due to fecal urgency and had a loose bowel movement with bright red blood. No further episodes, no abdominal pain, no personal or family GI history. Patient was driving with FaceTime on, person on the phone told him his eyes were drooping. Patient arrived back home, felt unwell, girlfriend reported he was pale. Patient went to the bedroom where he passed out onto the bed with whole body shaking lasting about 30 seconds, no loss of bowel or bladder control and did not bite tongue. After the episode, girlfriend walked him to the porch and awaiting arrival of EMS. Patient does not recall walking to the porch, girlfriend states his speech was slurred and he was confused. On arrival in the ER, had a severe pain on the left side of his chest that radiated down the left leg. Currently feels shaky.  Past history of ADHD, asthma, HTN. Family cardiac history.  Not on thinners.        Prior to Admission medications   Medication Sig Start Date End Date Taking? Authorizing Provider  amitriptyline  (ELAVIL ) 25 MG tablet Take 1 tablet (25 mg total) by mouth at bedtime. Patient not taking: Reported on 09/07/2016 07/18/14   Nabizadeh, Reza, MD  amphetamine-dextroamphetamine (ADDERALL) 5 MG tablet Take 1 tablet by mouth every evening. Patient not taking: Reported on 07/10/2020 06/28/14   [provider]  hydrochlorothiazide  (HYDRODIURIL ) 25 MG tablet Take 1 tablet (25 mg total) by mouth  daily. Patient not taking: Reported on 07/10/2020 10/30/17   Loetta Senior, MD  ondansetron  (ZOFRAN ) 4 MG tablet Take 1 tablet (4 mg total) by mouth every 6 (six) hours as needed for nausea or vomiting. 01/12/23   Bernis Ernst, PA-C    Allergies: Patient has no known allergies.    Review of Systems Negative except as per HPI Updated Vital Signs BP 132/79   Pulse 87   Temp 98.3 F (36.8 C)   Resp 20   Ht 5' 11 (1.803 m)   Wt 106.6 kg   SpO2 97%   BMI 32.78 kg/m   Physical Exam Vitals and nursing note reviewed.  Constitutional:      General: He is not in acute distress.    Appearance: He is well-developed. He is not diaphoretic.  HENT:     Head: Normocephalic and atraumatic.     Nose: Nose normal.     Mouth/Throat:     Mouth: Mucous membranes are moist.  Eyes:     Extraocular Movements: Extraocular movements intact.     Pupils: Pupils are equal, round, and reactive to light.  Cardiovascular:     Rate and Rhythm: Normal rate and regular rhythm.     Pulses: Normal pulses.     Heart sounds: Normal heart sounds.  Pulmonary:     Effort: Pulmonary effort is normal.     Breath sounds: Normal breath sounds.  Musculoskeletal:     Cervical back: Neck supple.  Right lower leg: No edema.     Left lower leg: No edema.  Skin:    General: Skin is warm and dry.     Coloration: Skin is not pale.     Findings: No erythema or rash.  Neurological:     Mental Status: He is alert and oriented to person, place, and time.     Cranial Nerves: No cranial nerve deficit.     Sensory: No sensory deficit.     Motor: No weakness.     Coordination: Coordination normal.     Gait: Gait normal.  Psychiatric:        Behavior: Behavior normal.     (all labs ordered are listed, but only abnormal results are displayed) Labs Reviewed  COMPREHENSIVE METABOLIC PANEL WITH GFR - Abnormal; Notable for the following components:      Result Value   CO2 19 (*)    Glucose, Bld 109 (*)    Total  Protein 5.8 (*)    ALT 47 (*)    All other components within normal limits  CBC - Abnormal; Notable for the following components:   MCHC 36.3 (*)    RDW 11.2 (*)    Platelets 121 (*)    All other components within normal limits  ETHANOL  RAPID URINE DRUG SCREEN, HOSP PERFORMED  D-DIMER, QUANTITATIVE  TROPONIN I (HIGH SENSITIVITY)  TROPONIN I (HIGH SENSITIVITY)    EKG: EKG Interpretation Date/Time:  Sunday September 19 2023 21:47:49 EDT Ventricular Rate:  104 PR Interval:  164 QRS Duration:  82 QT Interval:  324 QTC Calculation: 426 R Axis:   66  Text Interpretation: Sinus tachycardia Otherwise normal ECG When compared with ECG of 11-Jan-2023 20:23, PREVIOUS ECG IS PRESENT Confirmed by Darra Chew [phone]) on 09/20/2023 4:05:05 AM  Radiology: CT Head Wo Contrast Result Date: 09/20/2023 EXAM: CT HEAD WITHOUT CONTRAST 09/20/2023 02:43:21 AM TECHNIQUE: CT of the head was performed without the administration of intravenous contrast. Automated exposure control, iterative reconstruction, and/or weight based adjustment of the mA/kV was utilized to reduce the radiation dose to as low as reasonably achievable. COMPARISON: None available. CLINICAL HISTORY: Seizure, new-onset, no history of trauma. Possible seizure, pt does not remember what happened. FINDINGS: BRAIN AND VENTRICLES: No acute hemorrhage. No evidence of acute infarct. No hydrocephalus. No extra-axial collection. No mass effect or midline shift. ORBITS: No acute abnormality. SINUSES: No acute abnormality. SOFT TISSUES AND SKULL: No acute soft tissue abnormality. No skull fracture. IMPRESSION: 1. No acute intracranial abnormality. Electronically signed by: Dorethia Molt MD 09/20/2023 02:49 AM EDT RP Workstation: [id number]   DG Chest 2 View Result Date: 09/19/2023 CLINICAL DATA:  Chest pain and anxiety EXAM: CHEST - 2 VIEW COMPARISON:  Chest x-ray 06/14/2023 FINDINGS: The heart size and mediastinal contours are within normal limits.  Both lungs are clear. The visualized skeletal structures are unremarkable. IMPRESSION: No active cardiopulmonary disease. Electronically Signed   By: Greig Pique M.D.   On: 09/19/2023 21:05     Procedures   Medications Ordered in the ED  morphine  (PF) 4 MG/ML injection 4 mg (4 mg Intravenous Patient Refused/Not Given 09/20/23 0318)  ondansetron  (ZOFRAN ) injection 4 mg (4 mg Intravenous Patient Refused/Not Given 09/20/23 0318)  sodium chloride  0.9 % bolus 1,000 mL (1,000 mLs Intravenous New Bag/Given 09/20/23 0318)  Medical Decision Making Amount and/or Complexity of Data Reviewed Labs: ordered. Radiology: ordered.   This patient presents to the ED for concern of as above, this involves an extensive number of treatment options, and is a complaint that carries with it a high risk of complications and morbidity.  The differential diagnosis includes but not limited to GI bleed, hemorrhoid, colitis, seizure, electrolyte/metabolic, ACS, arrhythmia, withdrawal or substance use   Co morbidities / Chronic conditions that complicate the patient evaluation  ADHD, asthma, HTN   Additional history obtained:  Additional history obtained from EMR External records from outside source obtained and reviewed including prior labs on file. Prior head CT dated 2023 that was normal.    Lab Tests:  I Ordered, and personally interpreted labs.  The pertinent results include:  CBC without significant findings, CMP with mildly elevated 47, otherwise no significant changes. Dimer negative. Troponin negative x 2. Etoh negative. UDS negative    Imaging Studies ordered:  I ordered imaging studies including CT head, CXR  I independently visualized and interpreted imaging which showed no acute process  I agree with the radiologist interpretation   Cardiac Monitoring: / EKG:  The patient was maintained on a cardiac monitor.  I personally viewed and interpreted the  cardiac monitored which showed an underlying rhythm of: sinus tachycardia, rate 104   Problem List / ED Course / Critical interventions / Medication management  23 year old male presents after multiple complaints as listed above including GI bleed, syncope/seizure-like activity, chest pain.  Workup today is largely reassuring.  Exam is normal.  Orthostatic vitals are normal.  Discussed seizure precautions with patient.  He is referred to neurology for follow-up and advised not to drive.  He is referred to GI, call to schedule appointment with concerns for blood in stool.  Return to ER for worsening or concerning symptoms.  If no PCP, establish care with [facility]. I ordered medication including IVF   Reevaluation of the patient after these medicines showed that the patient stable/improved  I have reviewed the patients home medicines and have made adjustments as needed   Consultations Obtained:  I requested consultation with the ER attending, Dr. Darra,  and discussed lab and imaging findings as well as pertinent plan - they recommend: agrees with plan of care   Social Determinants of Health:  Lives with family    Test / Admission - Considered:  Stable for dc      Final diagnoses:  Seizure-like activity (HCC)  Blood in stool  Chest pain, unspecified type    ED Discharge Orders          Ordered    Ambulatory referral to Neurology       Comments: An appointment is requested in approximately: 2 weeks   09/20/23 0346               Beverley Leita LABOR, PA-C 09/20/23 0407    Darra Fonda MATSU, MD 09/23/23 [phone]

## 2023-09-20 NOTE — ED Notes (Signed)
 Patient returned from CT

## 2024-01-18 IMAGING — CT CT NECK W/ CM
3 of 4 series · 13 of 33 positions shown, 16 images · IV contrast (agent unspecified)
Comparison: None

CLINICAL DATA: Provided history: Soft tissue swelling, infection
suspected, neck x-ray done. Additional history provided: Patient
reports sore throat for 1 week, worsening, difficulty swallowing.

EXAM:
CT NECK WITH CONTRAST
TECHNIQUE: Multidetector CT imaging of the neck was performed using the
standard protocol following the bolus administration of intravenous
contrast.

[Series 3: axial neck · axial · 0.56mm/px · z∈[-288,-96]mm · 5 of 144 slices shown, 7 images]
[im 24/144  soft-tissue]
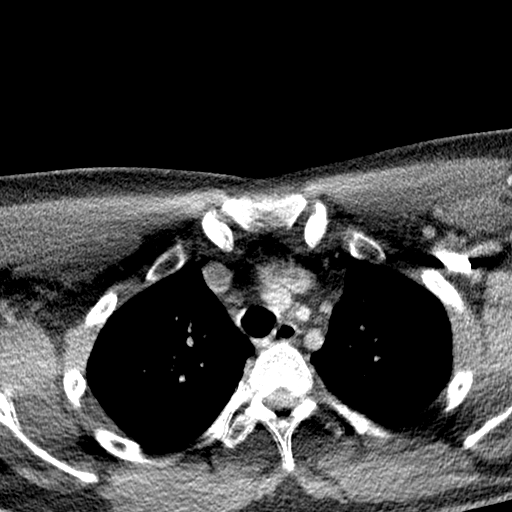
[im 24/144  bone]
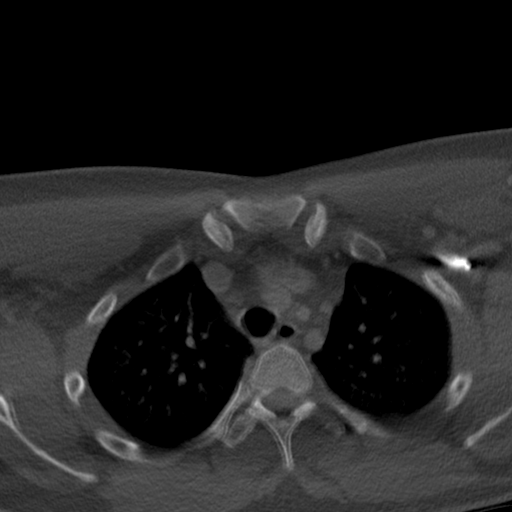
[im 48/144  bone]
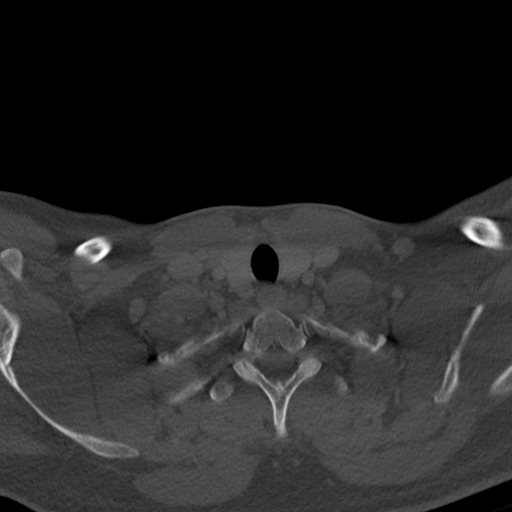
[im 72/144  bone]
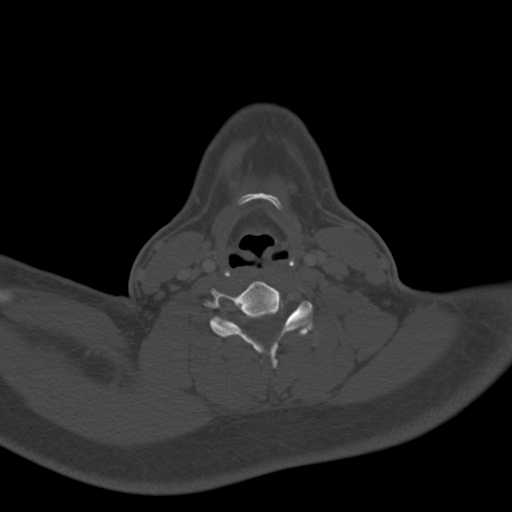
[im 96/144  bone]
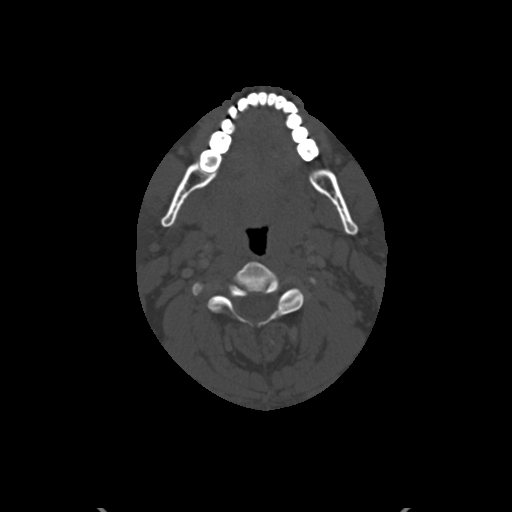
[im 120/144  soft-tissue]
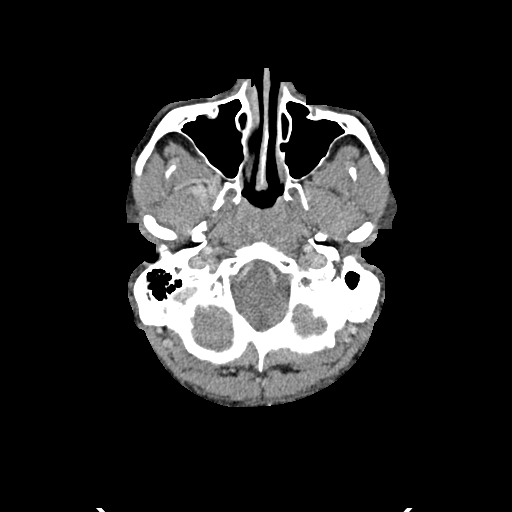
[im 120/144  bone]
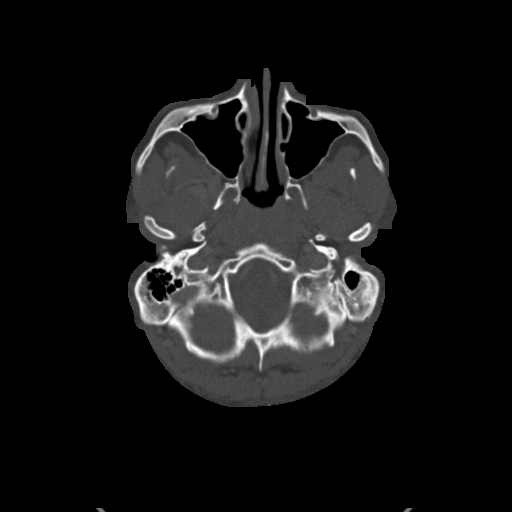

[Series 6: coronal · coronal · 0.40mm/px · 3 of 113 slices shown]
[im 23/113  bone]
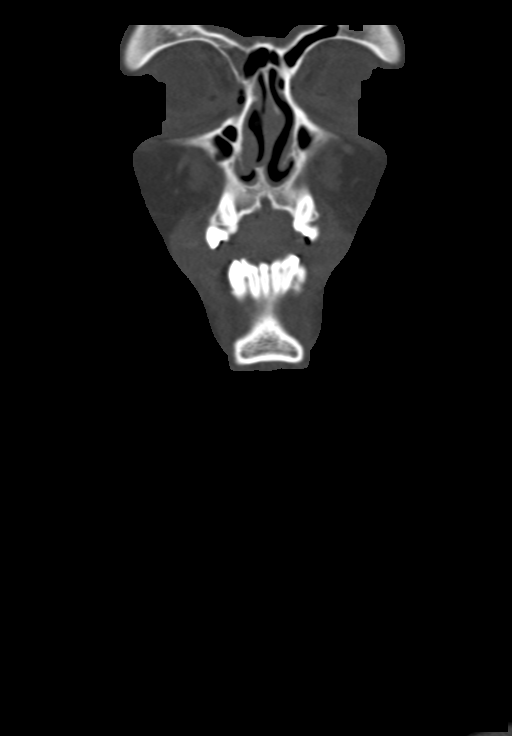
[im 45/113  bone]
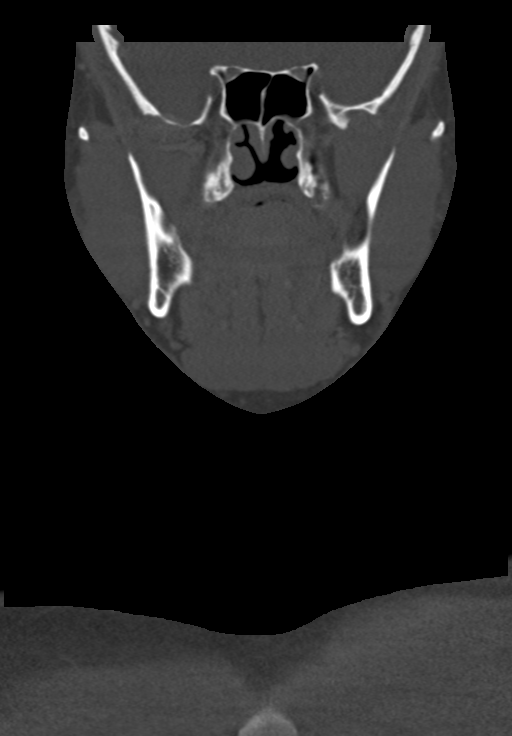
[im 68/113  bone]
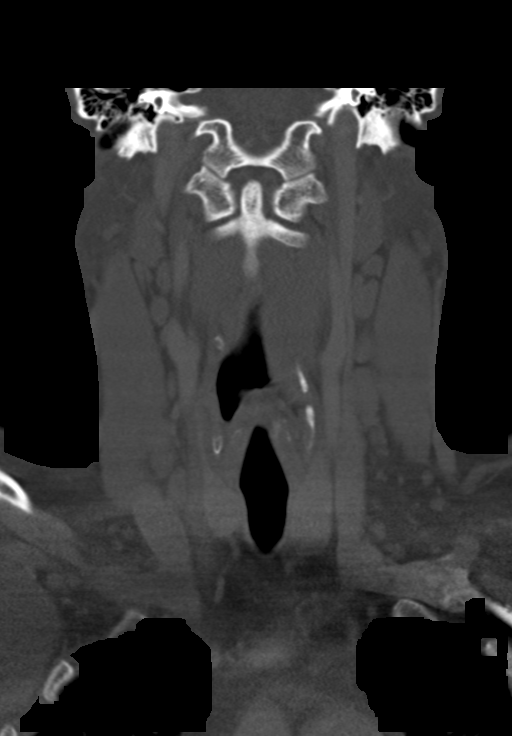

[Series 7: sagittal · sagittal · 0.59mm/px · 5 of 89 slices shown, 6 images]
[im 30/89  bone]
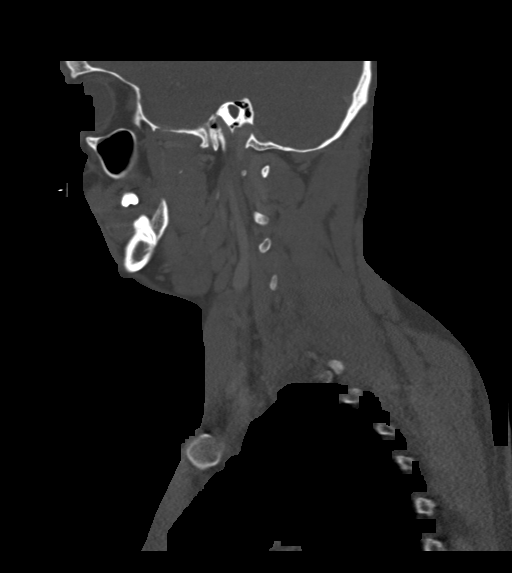
[im 37/89  bone]
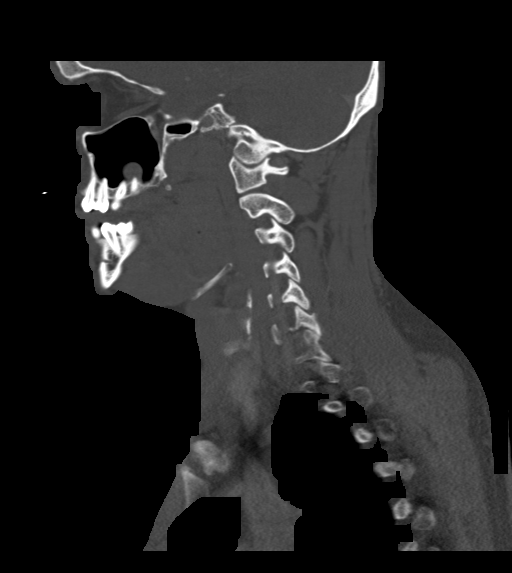
[im 45/89  soft-tissue]
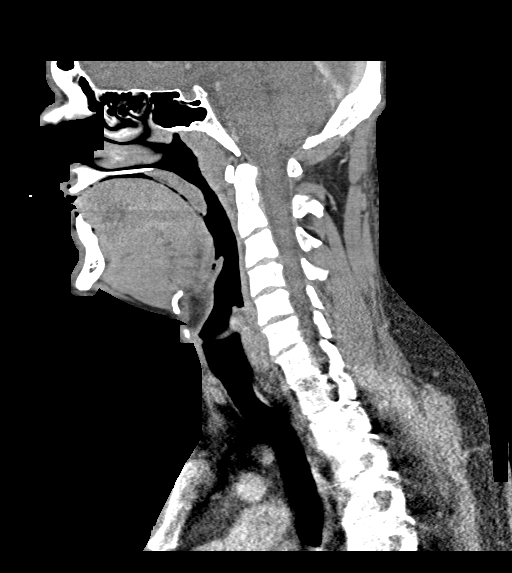
[im 45/89  bone]
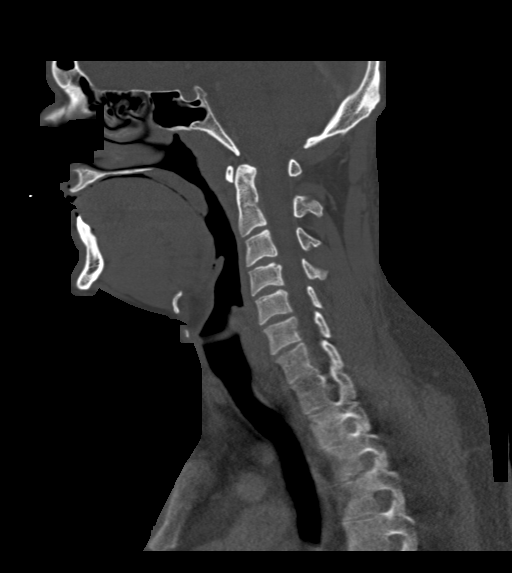
[im 52/89  bone]
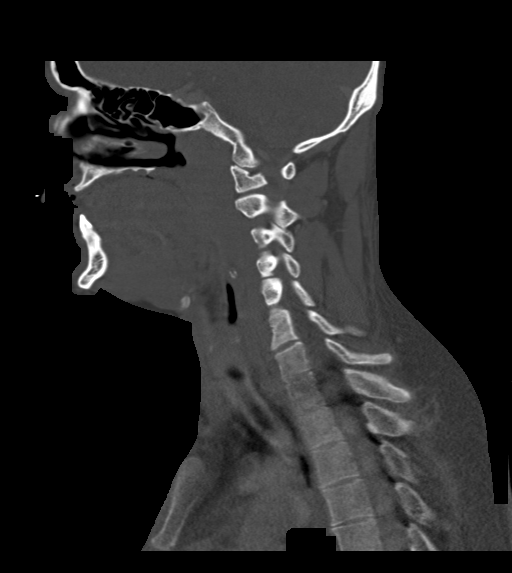
[im 59/89  bone]
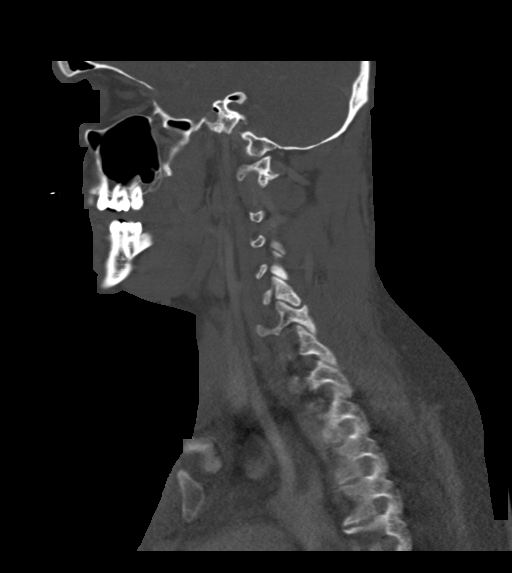

[13 of 33 positions shown; findings below may reference images not displayed]

RADIATION DOSE REDUCTION: This exam was performed according to the
departmental dose-optimization program which includes automated
exposure control, adjustment of the mA and/or kV according to
patient size and/or use of iterative reconstruction technique.

CONTRAST:  75mL OMNIPAQUE IOHEXOL 300 MG/ML  SOLN
FINDINGS: Pharynx and larynx: Symmetric prominence of the palatine tonsils. No
evidence of peritonsillar abscess. The oropharyngeal airway remains
patent. No appreciable swelling or discrete mass elsewhere within
the oral cavity, pharynx or larynx. Unremarkable appearance of the
epiglottis. No retropharyngeal collection.

Salivary glands: No inflammation, mass, or stone.

Thyroid: Normal.

Lymph nodes: Bilateral level [DATE] lymphadenopathy with lymph nodes
measuring up to 16 mm in short axis (for instance as seen on series
6, image 63).

Vascular: The major vascular structures of the neck are patent.

Limited intracranial: No evidence of acute intracranial abnormality
within the field of view.

Visualized orbits: No orbital mass or acute orbital finding.

Mastoids and visualized paranasal sinuses: The right frontal sinus
is developmentally absent. Small-volume fluid within a posterior
right ethmoid air cell. Mild mucosal thickening and small mucous
retention cysts within the bilateral maxillary sinuses. No
significant mastoid effusion.

Skeleton: Cervicothoracic levocurvature. Straightening of the
expected cervical lordosis. No acute bony abnormality or aggressive
osseous lesion.

Upper chest: No consolidation within the imaged lung apices.
IMPRESSION: Symmetric prominence of the palatine tonsils. Bilateral level [DATE]
lymphadenopathy (with lymph nodes measuring up to 16 mm in short
axis). This constellation of findings likely reflects acute
pharyngitis/tonsillitis with reactive lymphadenopathy given the
provided history. However, clinical follow-up is recommended (with
imaging follow-up as warranted) to ensure resolution and to exclude
alternative etiologies. No evidence of peritonsillar abscess.

Right ethmoid and bilateral maxillary sinus disease, as described.

Cervicothoracic levocurvature.
